# Patient Record
Sex: Male | Born: 1940 | Race: White | Hispanic: No | Marital: Married | Smoking: Never smoker
Health system: Southern US, Community
[De-identification: ages and names within clinical notes are randomized; demographics above are authoritative.]

## PROBLEM LIST (undated history)

## (undated) DIAGNOSIS — R413 Other amnesia: Secondary | ICD-10-CM

## (undated) DIAGNOSIS — R011 Cardiac murmur, unspecified: Secondary | ICD-10-CM

## (undated) DIAGNOSIS — I2510 Atherosclerotic heart disease of native coronary artery without angina pectoris: Secondary | ICD-10-CM

## (undated) DIAGNOSIS — I739 Peripheral vascular disease, unspecified: Secondary | ICD-10-CM

## (undated) DIAGNOSIS — R55 Syncope and collapse: Secondary | ICD-10-CM

## (undated) DIAGNOSIS — G4730 Sleep apnea, unspecified: Secondary | ICD-10-CM

## (undated) DIAGNOSIS — I779 Disorder of arteries and arterioles, unspecified: Secondary | ICD-10-CM

## (undated) DIAGNOSIS — E785 Hyperlipidemia, unspecified: Secondary | ICD-10-CM

## (undated) HISTORY — DX: Hyperlipidemia, unspecified: E78.5

## (undated) HISTORY — DX: Sleep apnea, unspecified: G47.30

## (undated) HISTORY — DX: Atherosclerotic heart disease of native coronary artery without angina pectoris: I25.10

## (undated) HISTORY — DX: Syncope and collapse: R55

## (undated) HISTORY — DX: Cardiac murmur, unspecified: R01.1

## (undated) HISTORY — DX: Other amnesia: R41.3

## (undated) HISTORY — PX: HAND SURGERY: SHX662

## (undated) HISTORY — PX: APPENDECTOMY: SHX54

## (undated) HISTORY — DX: Disorder of arteries and arterioles, unspecified: I77.9

## (undated) HISTORY — DX: Peripheral vascular disease, unspecified: I73.9

---

## 2002-05-30 ENCOUNTER — Ambulatory Visit (HOSPITAL_COMMUNITY): Admission: RE | Admit: 2002-05-30 | Discharge: 2002-05-31 | Payer: Self-pay | Admitting: Internal Medicine

## 2002-05-30 ENCOUNTER — Encounter: Payer: Self-pay | Admitting: *Deleted

## 2004-03-16 HISTORY — PX: CORONARY ARTERY BYPASS GRAFT: SHX141

## 2004-04-21 ENCOUNTER — Ambulatory Visit (HOSPITAL_COMMUNITY): Admission: RE | Admit: 2004-04-21 | Discharge: 2004-04-21 | Payer: Self-pay | Admitting: Vascular Surgery

## 2004-08-05 ENCOUNTER — Ambulatory Visit (HOSPITAL_COMMUNITY): Admission: RE | Admit: 2004-08-05 | Discharge: 2004-08-05 | Payer: Self-pay | Admitting: Cardiology

## 2004-08-05 ENCOUNTER — Ambulatory Visit: Payer: Self-pay | Admitting: *Deleted

## 2004-08-14 ENCOUNTER — Inpatient Hospital Stay (HOSPITAL_COMMUNITY): Admission: RE | Admit: 2004-08-14 | Discharge: 2004-08-18 | Payer: Self-pay | Admitting: Surgery

## 2006-05-05 ENCOUNTER — Ambulatory Visit: Payer: Self-pay | Admitting: Vascular Surgery

## 2007-07-01 ENCOUNTER — Ambulatory Visit: Payer: Self-pay | Admitting: Vascular Surgery

## 2007-07-08 ENCOUNTER — Ambulatory Visit: Payer: Self-pay | Admitting: Internal Medicine

## 2007-07-08 LAB — CONVERTED CEMR LAB
CO2: 31 meq/L (ref 19–32)
Calcium: 9.5 mg/dL (ref 8.4–10.5)
Chloride: 107 meq/L (ref 96–112)
Creatinine, Ser: 1 mg/dL (ref 0.4–1.5)
Eosinophils Relative: 1.7 % (ref 0.0–5.0)
GFR calc Af Amer: 96 mL/min
Glucose, Bld: 82 mg/dL (ref 70–99)
Lymphocytes Relative: 25.9 % (ref 12.0–46.0)
MCHC: 34.3 g/dL (ref 30.0–36.0)
MCV: 92 fL (ref 78.0–100.0)
Monocytes Relative: 10.2 % (ref 3.0–12.0)
Neutro Abs: 3.4 10*3/uL (ref 1.4–7.7)
Neutrophils Relative %: 62 % (ref 43.0–77.0)
Platelets: 143 10*3/uL — ABNORMAL LOW (ref 150–400)
RBC: 4.97 M/uL (ref 4.22–5.81)
RDW: 12.2 % (ref 11.5–14.6)

## 2007-07-15 ENCOUNTER — Ambulatory Visit (HOSPITAL_COMMUNITY): Admission: RE | Admit: 2007-07-15 | Discharge: 2007-07-15 | Payer: Self-pay | Admitting: Internal Medicine

## 2007-07-15 ENCOUNTER — Ambulatory Visit: Payer: Self-pay | Admitting: Internal Medicine

## 2007-11-18 ENCOUNTER — Encounter: Admission: RE | Admit: 2007-11-18 | Discharge: 2007-11-18 | Payer: Self-pay | Admitting: Neurology

## 2008-03-16 HISTORY — PX: CARDIAC CATHETERIZATION: SHX172

## 2008-04-26 ENCOUNTER — Ambulatory Visit: Payer: Self-pay | Admitting: Internal Medicine

## 2008-05-01 ENCOUNTER — Ambulatory Visit (HOSPITAL_COMMUNITY): Admission: RE | Admit: 2008-05-01 | Discharge: 2008-05-01 | Payer: Self-pay | Admitting: Cardiology

## 2008-05-01 ENCOUNTER — Ambulatory Visit: Payer: Self-pay | Admitting: Cardiology

## 2008-05-25 ENCOUNTER — Encounter: Payer: Self-pay | Admitting: Internal Medicine

## 2008-05-28 ENCOUNTER — Encounter: Payer: Self-pay | Admitting: Internal Medicine

## 2008-05-28 ENCOUNTER — Ambulatory Visit: Payer: Self-pay | Admitting: Internal Medicine

## 2008-05-28 DIAGNOSIS — I25810 Atherosclerosis of coronary artery bypass graft(s) without angina pectoris: Secondary | ICD-10-CM | POA: Insufficient documentation

## 2008-05-28 DIAGNOSIS — R002 Palpitations: Secondary | ICD-10-CM | POA: Insufficient documentation

## 2008-06-12 ENCOUNTER — Ambulatory Visit: Payer: Self-pay | Admitting: Vascular Surgery

## 2008-07-06 ENCOUNTER — Ambulatory Visit: Payer: Self-pay | Admitting: Internal Medicine

## 2008-07-06 ENCOUNTER — Encounter: Payer: Self-pay | Admitting: Internal Medicine

## 2008-07-23 ENCOUNTER — Encounter: Payer: Self-pay | Admitting: Internal Medicine

## 2008-07-23 ENCOUNTER — Ambulatory Visit: Payer: Self-pay

## 2008-08-08 ENCOUNTER — Telehealth: Payer: Self-pay | Admitting: Internal Medicine

## 2008-08-08 ENCOUNTER — Ambulatory Visit: Payer: Self-pay | Admitting: Internal Medicine

## 2008-08-08 DIAGNOSIS — R55 Syncope and collapse: Secondary | ICD-10-CM

## 2008-08-22 ENCOUNTER — Telehealth: Payer: Self-pay | Admitting: Internal Medicine

## 2008-12-06 ENCOUNTER — Ambulatory Visit: Payer: Self-pay | Admitting: Vascular Surgery

## 2008-12-24 ENCOUNTER — Ambulatory Visit: Payer: Self-pay | Admitting: Internal Medicine

## 2009-02-12 ENCOUNTER — Encounter: Payer: Self-pay | Admitting: Internal Medicine

## 2009-04-15 ENCOUNTER — Ambulatory Visit: Payer: Self-pay | Admitting: Internal Medicine

## 2009-06-07 ENCOUNTER — Encounter: Payer: Self-pay | Admitting: Internal Medicine

## 2009-09-24 ENCOUNTER — Ambulatory Visit: Payer: Self-pay | Admitting: Cardiovascular Disease

## 2009-09-25 ENCOUNTER — Ambulatory Visit: Payer: Self-pay | Admitting: Cardiology

## 2009-09-25 ENCOUNTER — Encounter: Payer: Self-pay | Admitting: Cardiovascular Disease

## 2009-09-25 ENCOUNTER — Encounter (HOSPITAL_COMMUNITY): Admission: RE | Admit: 2009-09-25 | Discharge: 2009-11-20 | Payer: Self-pay | Admitting: Cardiovascular Disease

## 2009-09-25 ENCOUNTER — Encounter: Payer: Self-pay | Admitting: Cardiology

## 2009-09-25 ENCOUNTER — Ambulatory Visit (HOSPITAL_COMMUNITY): Admission: RE | Admit: 2009-09-25 | Discharge: 2009-09-25 | Payer: Self-pay | Admitting: Cardiovascular Disease

## 2009-09-25 ENCOUNTER — Ambulatory Visit: Payer: Self-pay

## 2010-01-06 ENCOUNTER — Encounter: Payer: Self-pay | Admitting: Internal Medicine

## 2010-04-15 NOTE — Letter (Signed)
Summary: [facility] Office Visit Note   [facility] Office Visit Note   Imported By: Roderic Ovens 01/17/2010 12:49:21  _____________________________________________________________________  External Attachment:    Type:   Image     Comment:   External Document

## 2010-04-15 NOTE — Letter (Signed)
Summary: [facility]   [facility]   Imported By: Kassie Mends 03/19/2009 12:03:53  _____________________________________________________________________  External Attachment:    Type:   Image     Comment:   External Document

## 2010-04-15 NOTE — Letter (Signed)
Summary: [facility] Office Note  [facility] Office Note   Imported By: Roderic Ovens 06/21/2009 15:51:34  _____________________________________________________________________  External Attachment:    Type:   Image     Comment:   External Document

## 2010-04-15 NOTE — Assessment & Plan Note (Signed)
Summary: Cardiology Nuclear Testing  Nuclear Med Background Indications for Stress Test: Evaluation for Ischemia, Graft Patency   History: Angioplasty, Asthma, CABG, Heart Catheterization, Myocardial Perfusion Study  History Comments: '04 PTCA-Dx1, LAD;  4/06 YQM:VHQION,  EF= 40-50%; 6/06 CABG x 5; '09 Loop Recorder (not active now); 2/10 Cath:Occ. SVG>small Dx2, other grafts patent; h/o SVT, OSA  Symptoms: Dizziness, DOE, Palpitations, Syncope    Nuclear Pre-Procedure Cardiac Risk Factors: Carotid Disease, Family History - CAD, Hypertension, Lipids Caffeine/Decaff Intake: none NPO After: 5:00 PM Lungs: clear IV 0.9% NS with Angio Cath: 22g     IV Site: R AC IV Started by: Milana Na EMT-P Chest Size (in) 42     Height (in): 68 Weight (lb): 177 BMI: 27.01  Nuclear Med Study 1 or 2 day study:  1 day     Stress Test Type:  Stress Reading MD:  Marca Ancona, MD     Referring MD:  Lorine Bears, MD Resting Radionuclide:  Technetium 9m Tetrofosmin     Resting Radionuclide Dose:  11 mCi  Stress Radionuclide:  Technetium 39m Tetrofosmin     Stress Radionuclide Dose:  30 mCi   Stress Protocol Exercise Time (min):  9:30 min     Max HR:  142 bpm     Predicted Max HR:  152 bpm  Max Systolic BP: 206 mm Hg     Percent Max HR:  93.42 %     METS: 10.7 Rate Pressure Product:  62952    Stress Test Technologist:  Rea [employer] CMA-N     Nuclear Technologist:  Domenic Polite CNMT  Rest Procedure  Myocardial perfusion imaging was performed at rest 45 minutes following the intravenous administration of Myoview Technetium 68m Tetrofosmin.  Stress Procedure  The patient exercised for 9:30.  The patient stopped due to fatigue and denied any chest pain.  There were ST-T wave changes with exercise, that quickly resolved.  There were occasional PVC's and PAC's and he had a mild hypertensive response to exercise, 206/79.  Myoview was injected at peak exercise and myocardial perfusion imaging  was performed after a brief delay.  QPS Transient Ischemic Dilatation:  1.1  (Normal <1.22)  Lung/Heart Ratio:  .16  (Normal <0.45)  Quantitative Gated Spect Images QGS EDV:  107 ml QGS ESV:  50 ml QGS EF:  53 % QGS cine images:  Septal hypokinesis, may be due to prior cardiac surgery.    Overall Impression  Exercise Capacity: Good exercise capacity. BP Response: Hypertensive blood pressure response. Clinical Symptoms: Fatigue, no chest pain.  ECG Impression: NSR with baseline anterolateral and inferior T wave inversions.  2 mm horizontal ST depression at peak exercise, predominantly resolved by 50 seconds into recovery.  Overall Impression: Good exercise tolerance.  There were ischemic ECG changes noted on ETT.  No chest pain.  Normal perfusion images with no evidence of ischemia or infarction.  Normal TID ratio.  Overall Impression Comments: Equivocal study with ECG changes but normal perfusion images and good exercise tolerance in patient with known CAD.  Septal wall motion abnormality may be from prior CABG.

## 2010-04-15 NOTE — Miscellaneous (Signed)
Summary: Orders Update  Clinical Lists Changes  Orders: Added new Test order of Carotid Duplex (Carotid Duplex) - Signed 

## 2010-06-03 ENCOUNTER — Other Ambulatory Visit: Payer: Self-pay | Admitting: Cardiovascular Disease

## 2010-06-03 DIAGNOSIS — I6529 Occlusion and stenosis of unspecified carotid artery: Secondary | ICD-10-CM

## 2010-06-04 ENCOUNTER — Encounter (INDEPENDENT_AMBULATORY_CARE_PROVIDER_SITE_OTHER): Payer: 59 | Admitting: *Deleted

## 2010-06-04 DIAGNOSIS — I6529 Occlusion and stenosis of unspecified carotid artery: Secondary | ICD-10-CM

## 2010-06-17 ENCOUNTER — Encounter: Payer: Self-pay | Admitting: *Deleted

## 2010-06-17 ENCOUNTER — Telehealth: Payer: Self-pay | Admitting: *Deleted

## 2010-06-17 NOTE — Telephone Encounter (Signed)
Spoke with wife pt needs to have loop recorder removed is at [employer] and also is irritating breast bone per Dr Graciela Husbands schedule for 06/20/10 at 12:00 .Wife  Aware will give instructions to her as well as an order for labs to be done at [place] [facility].

## 2010-06-18 ENCOUNTER — Encounter: Payer: Self-pay | Admitting: Internal Medicine

## 2010-06-18 ENCOUNTER — Encounter: Payer: Self-pay | Admitting: Cardiovascular Disease

## 2010-06-20 ENCOUNTER — Ambulatory Visit (HOSPITAL_COMMUNITY): Payer: 59

## 2010-06-20 ENCOUNTER — Ambulatory Visit (HOSPITAL_COMMUNITY)
Admission: RE | Admit: 2010-06-20 | Discharge: 2010-06-20 | Disposition: A | Discharge status: Home / Self Care | Payer: 59 | Source: Ambulatory Visit | Attending: Internal Medicine | Admitting: Internal Medicine

## 2010-06-20 DIAGNOSIS — R0609 Other forms of dyspnea: Secondary | ICD-10-CM | POA: Insufficient documentation

## 2010-06-20 DIAGNOSIS — R55 Syncope and collapse: Secondary | ICD-10-CM | POA: Insufficient documentation

## 2010-06-20 DIAGNOSIS — I2510 Atherosclerotic heart disease of native coronary artery without angina pectoris: Secondary | ICD-10-CM | POA: Insufficient documentation

## 2010-06-20 DIAGNOSIS — R0989 Other specified symptoms and signs involving the circulatory and respiratory systems: Secondary | ICD-10-CM | POA: Insufficient documentation

## 2010-06-20 DIAGNOSIS — Z4509 Encounter for adjustment and management of other cardiac device: Secondary | ICD-10-CM | POA: Insufficient documentation

## 2010-06-24 ENCOUNTER — Encounter: Payer: Self-pay | Admitting: Cardiovascular Disease

## 2010-06-26 ENCOUNTER — Ambulatory Visit: Payer: 59 | Admitting: Cardiovascular Disease

## 2010-07-01 LAB — BASIC METABOLIC PANEL
BUN: 13 mg/dL (ref 6–23)
CO2: 29 mEq/L (ref 19–32)
Creatinine, Ser: 1.02 mg/dL (ref 0.4–1.5)
GFR calc non Af Amer: >60 mL/min (ref >60)

## 2010-07-01 LAB — CBC
RBC: 4.91 MIL/uL (ref 4.22–5.81)
RDW: 12.9 % (ref 11.5–15.5)

## 2010-07-09 NOTE — Op Note (Signed)
  NAME:  [name], [name]                ACCOUNT NO.:  [id number]  MEDICAL RECORD NO.:  [id number]           PATIENT TYPE:  O  LOCATION:  [facility]                         FACILITY:  [facility]  PHYSICIAN:  Duke Salvia, MD, FACCDATE OF BIRTH:  March 20, 1940  DATE OF PROCEDURE:  06/20/2010 DATE OF DISCHARGE:                              OPERATIVE REPORT   PREOPERATIVE DIAGNOSIS:  Syncope with previously implanted loop.  POSTOPERATIVE DIAGNOSIS:  Syncope with previously implanted loop.  PROCEDURE:  Explantation of the loop recorder.  DESCRIPTION:  Following obtaining informed consent, the patient was brought to the electrophysiology laboratory and placed on the fluoroscopic table in supine position.  After routine prep and drape, lidocaine was infiltrated overlying the previous incision and carried down to the layer of the device pocket.  The pocket was opened, the device was explanted and the retaining suture was removed.  The anterior and posterior aspect of the pocket were apposed with a 3-0 Vicryl suture.  The pocket was copiously irrigated with antibiotic containing saline solution.  The wounds were then closed in three layers in normal fashion.  The wound was washed, dried, benzoin and Steri-Strip dressing applied.  Needle counts, sponge counts and instrument counts were correct at the end of the procedure according to the staff.  The patient tolerated the procedure without apparent complication.     Duke Salvia, MD, [facility]     SCK/MEDQ  D:  06/20/2010  T:  06/21/2010  Job:  [id number]  Electronically Signed by Sherryl Manges MD [facility] on 07/09/2010 08:49:33 AM

## 2010-07-29 NOTE — Assessment & Plan Note (Signed)
[facility]                        [place] CARDIOLOGY OFFICE NOTE   NAME:Erichsen, HENDRICKS SCHWANDT                       MRN:          [id number]  DATE:12/24/2008                            DOB:          1940/06/27    Mr. Mysliwiec is seen in followup for syncope.  He has normal left  ventricular function.  He has a loop implanted.  He had no intercurrent  episodes.  He has no complaints of chest pain or shortness of breath.   His memory continues to be an issue.  He is scheduled to see Dr. Sandria Manly  next month or two.   His medications include Lipitor, Lexapro, aspirin.   On examination, his blood pressure today was 136/70, his pulse was 64.  His neck veins were flat.  His lungs were clear.  Heart sounds were  regular.  Extremities were without edema.  He is in no acute distress.   Interrogation of his loop monitor demonstrated only sinus rhythm.   IMPRESSION:  1. Syncope without recurrent events.  2. Status post loop recorder with prior events being associated with      sinus rhythm.  3. Dyslipidemia, on atorvastatin.   We will plan to recheck his lipids today.   We will plan to see him again in 3 months' time or following an other  syncopal episode.     Duke Salvia, MD, [facility]  Electronically Signed    SCK/MedQ  DD: 12/24/2008  DT: 12/25/2008  Job #: [id number]

## 2010-07-29 NOTE — Procedures (Signed)
CAROTID DUPLEX EXAM   INDICATION:  Follow up known carotid disease.   HISTORY:  Diabetes:  No  Cardiac:  Mitral valve prolapse and CABG in 2006.  Hypertension:  No  Smoking:  No  Previous Surgery:  CABG in 2006  CV History:  Amaurosis Fugax:  Paresthesias:  Hemiparesis:                                       RIGHT             LEFT  Brachial systolic pressure:         132               130  Brachial Doppler waveforms:         Biphasic          Biphasic  Vertebral direction of flow:        Antegrade         Antegrade  DUPLEX VELOCITIES (cm/sec)  CCA peak systolic                   120               94  ECA peak systolic                   284               255  ICA peak systolic                   305               86  ICA end diastolic                   90                24  PLAQUE MORPHOLOGY:                  Heterogeneous     Heterogeneous  PLAQUE AMOUNT:                      Moderate to severe                  Mild  PLAQUE LOCATION:                    ICA and ECA       ICA and ECA   IMPRESSION:  1. 60% to 79% stenosis noted in the right internal carotid artery.  2. 20% to 39% stenosis noted in the left internal carotid artery.  3. Antegrade bilateral vertebral arteries.   ___________________________________________  Di Kindle. Edilia Bo, M.D.   MG/MEDQ  D:  06/12/2008  T:  06/13/2008  Job:  [id number]

## 2010-07-29 NOTE — Assessment & Plan Note (Signed)
[facility]                        [place] CARDIOLOGY OFFICE NOTE   NAME:CHRISCOEDavie, [name]                       MRN:          [id number]  DATE:04/15/2009                            DOB:          1940-09-02    [name] is seen in followup for palpitations and syncope.  He had a  loop recorder implanted many years ago which has identified nothing,  with intercurrent recurrent syncope, no arrhythmia has been detected.   He has no complaints apart from shortness of breath.  He apparently has  a diagnosis of sleep apnea but has been reluctant to use a CPAP.   Current medications include Namenda, Lexapro, and Lipitor as well as  aspirin and Flomax.   PHYSICAL EXAMINATION:  VITAL SIGNS:  His blood pressure is 114/70, his  pulse was 89.  His weight was 173, which is down 4 pounds.  HEENT:  Normal.  LUNGS:  Clear.  HEART:  Sounds were regular.  Device pocket was well healed.  EXTREMITIES:  Without edema.   Interrogation of his loop was not possible.   IMPRESSION:  1. Syncope.  2. Status post loop recorder implantation, identified only sinus      rhythm with syncope, now at end of service.  3. Shortness of breath   We discussed whether we should takeout [name]'s loop recorder.  As  it has not been doing any harm, we have decided to leave it in for now.  It could always be removed in the future if he desires.   Related to his shortness of breath is a little bit concerning.  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  We will defer management of his dyspnea to them.   My recollection from his more remote chart was that he had normal LV  function, nonischemic Myoview.  We do know also that he has mild carotid  disease for which he had been followed by the vascular surgeons.     Duke Salvia, MD, [facility]  Electronically Signed   SCK/MedQ  DD: 04/15/2009  DT: 04/16/2009  Job #:  [id number]

## 2010-07-29 NOTE — Procedures (Signed)
CAROTID DUPLEX EXAM   INDICATION:  Followup carotid disease.   HISTORY:  Diabetes:  No.  Cardiac:  Mitral valve prolapse, CABG.  Hypertension:  No.  Smoking:  No.  Previous Surgery:  No.  CV History:  Currently asymptomatic.  Amaurosis Fugax No, Paresthesias No, Hemiparesis No                                       RIGHT             LEFT  Brachial systolic pressure:         142               148  Brachial Doppler waveforms:         Normal            Normal  Vertebral direction of flow:        Antegrade         Antegrade  DUPLEX VELOCITIES (cm/sec)  CCA peak systolic                   111               124  ECA peak systolic                   224               268  ICA peak systolic                   240               76  ICA end diastolic                   44                24  PLAQUE MORPHOLOGY:                  Mixed             Mixed  PLAQUE AMOUNT:                      Moderate / severe Mild  PLAQUE LOCATION:                    ICA/ECA/CCA       ICA/ECA/CCA   IMPRESSION:  1. 60-79% stenosis of the right internal carotid artery.  2. 1-39% stenosis of the left internal carotid artery.  3. No significant change noted when compared to the previous exam on      06/12/2008.   ___________________________________________  Di Kindle. Edilia Bo, M.D.   CH/MEDQ  D:  12/07/2008  T:  12/07/2008  Job:  [id number]

## 2010-07-29 NOTE — Assessment & Plan Note (Signed)
OFFICE VISIT   [name], [name]  DOB:  [birth year]                                       06/12/2008  CHART#:[id number]   I saw the patient in the office today for continued followup of his  carotid disease.  I had last seen him in February of 2008 when he had a  60-79% right carotid stenosis with minimal stenosis on the left.  I was  to see him back in 6 months, however, he was then lost to followup.  He  comes in today for a followup carotid study.  Of note, since I saw him  last he denies any history of stroke, TIAs, expressive or receptive  aphasia or amaurosis fugax.  His main complaint has been problems with  his memory which apparently began after his open heart surgery back in  2006.   PAST MEDICAL HISTORY:  His past medical history is significant for  coronary artery disease and previous history of hypertension.  He denies  any history of diabetes or history of congestive heart failure or COPD.   FAMILY HISTORY:  There is no history of premature cardiovascular  disease.   SOCIAL HISTORY:  He does not smoke tobacco.   REVIEW OF SYSTEMS:  He does have some occasional chest pain which has  been stable.  He notes occasional palpitations.  He has had a history of  atrial fibrillation in the past reportedly.  His only other complaint is  occasional dizziness.  Review of systems is otherwise unremarkable and  is documented on the medical history form in his chart.   PHYSICAL EXAMINATION:  This is a pleasant 70 year old gentleman who  appears his stated age.  His blood pressure is 126/67, heart rate is 67.  He has bilateral carotid bruits.  Lungs are clear bilaterally to  auscultation.  On cardiac exam he has a regular rate and rhythm.  Neurologic exam is nonfocal with good strength in his upper extremities  and lower extremities bilaterally.   Carotid duplex scan shows a 60-79% right carotid stenosis with a less  than 39% left carotid stenosis.  The  velocities on the right have  increased slightly compared with to his previous study a year ago.  However, the stenosis is clearly less than 80%.  Both vertebral arteries  are patent with normally directed flow.   I have explained we generally would not consider right carotid  endarterectomy unless the stenosis progressed to greater than 80% or he  developed new neurologic symptoms.  I plan on seeing him back in 6  months with a followup duplex scan.  He knows to call sooner if he has  problems.  In the meantime he knows to continue taking his aspirin.   Di Kindle. Edilia Bo, M.D.  Electronically Signed   CSD/MEDQ  D:  06/12/2008  T:  06/13/2008  Job:  [id number]   cc:   Dr Ritta Slot, M.D.

## 2010-07-29 NOTE — Procedures (Signed)
CAROTID DUPLEX EXAM   INDICATION:  Follow-up known carotid artery disease.   HISTORY:  Diabetes:  No.  Cardiac:  Mitral valve prolapse.  Hypertension:  No.  Smoking:  No.  Previous Surgery:  CABG in 2006.  CV History:  Amaurosis Fugax No, Paresthesias No, Hemiparesis No                                       RIGHT             LEFT  Brachial systolic pressure:         138               140  Brachial Doppler waveforms:         Biphasic          Biphasic  Vertebral direction of flow:        Antegrade         Antegrade  DUPLEX VELOCITIES (cm/sec)  CCA peak systolic                   78                __________  ECA peak systolic                   245               203  ICA peak systolic                   211               62  ICA end diastolic                   66                17  PLAQUE MORPHOLOGY:                  Heterogenous      Heterogenous  PLAQUE AMOUNT:                      Moderate          Mild  PLAQUE LOCATION:                    ICA, ECA          ICA, ECA   IMPRESSION:  1. 60-79% stenosis noted in the right internal carotid artery.  2. 1-39% stenosis noted in the left internal carotid artery.  3. Antegrade bilateral vertebral arteries.   ___________________________________________  Di Kindle. Edilia Bo, M.D.   MG/MEDQ  D:  07/01/2007  T:  07/01/2007  Job:  [id number]

## 2010-07-29 NOTE — Op Note (Signed)
NAME:  [name], [name]                ACCOUNT NO.:  [id number]   MEDICAL RECORD NO.:  [id number]          PATIENT TYPE:  OIB   LOCATION:  2854                         FACILITY:  [facility]   PHYSICIAN:  Duke Salvia, MD, FACCDATE OF BIRTH:  1940/08/27   DATE OF PROCEDURE:  DATE OF DISCHARGE:  07/15/2007                               OPERATIVE REPORT   PROCEDURE:  Following the obtaining of informed consent, the patient was  brought to the electrophysiology laboratory and placed on the  fluoroscopic table in the supine position.  After routine prep and  drape, lidocaine was infiltrated over the externally mapped area about 2  cm caudal of the clavicle and 1 cm lateral of the sternum, where  electrograms were optimal.  The incision was carried down to the layer  of the prepectoral fascia and the pocket was created using sharp  dissection and electrocautery.  Hemostasis was obtained.   Two 2-0 silk sutures were placed in the cephalad aspect of the pocket  and used to secure a St. Jude Confirm Implantable Cardiac Monitor, model  [id number], serial number [id number].  The pocket was then closed in 3 layers  in the normal fashion.  The wound was washed, dried, and a benzoin Steri-  Strip dressing was applied.  Needle counts, sponge counts, and  instrument counts were correct at the end of procedure according to the  staff.  The patient tolerated the procedure without apparent  complications.      Duke Salvia, MD, [facility]  Electronically Signed     SCK/MEDQ  D:  07/15/2007  T:  07/16/2007  Job:  [id number]   cc:   Electrophysiology Laboratory  [facility]  Dr. Lestine Mount

## 2010-07-29 NOTE — Discharge Summary (Signed)
NAME:  [name], [name]                ACCOUNT NO.:  [id number]   MEDICAL RECORD NO.:  [id number]          PATIENT TYPE:  OIB   LOCATION:  2854                         FACILITY:  [facility]   PHYSICIAN:  Duke Salvia, MD, FACCDATE OF BIRTH:  [birth year]   DATE OF ADMISSION:  07/15/2007  DATE OF DISCHARGE:  07/15/2007                               DISCHARGE SUMMARY   FINAL DIAGNOSES:  1. Presyncope.      a.     No syncopal events.      b.     Monitor evaluation shows nonsustained ventricular       tachycardia/ST/2.5-second pauses/one 7-beat run of       supraventricular tachycardia.   SECONDARY DIAGNOSES:  1. Ischemic cardiomyopathy with recent Myoview study ejection fraction      of 63%.  2. Coronary artery bypass graft surgery, August 14, 2004.  3. History of supraventricular tachycardia controlled with verapamil      and Lanoxin.  4. Carotid duplex study showing 70% right internal carotid artery      stenosis.  5. Asthma.  6. Gastroesophageal reflux disease.  7. Peptic ulcer disease.  8. Hypertension.  9. Mild dementia.   PROCEDURE:  Jul 15, 2007, implant of loop recorder by Dr. Sherryl Manges.   HISTORY:  [name] is a 70 year old male with a history of ischemic  heart disease, bypass surgery in 2006.  His ejection fraction in a  recent Myoview study was read as 63%.   The patient has been having episodes of profound presyncope.  He  describes episodes of dizziness with his eyes darkening with some  presyncope.  He has had no actual frank syncope and he denies falls.  The episodes are quite brief.   He has a history of SVT in the past.  He has been prescribed verapamil  and Lanoxin.  He is taking these.  Carotid ultrasound showed a 70%  narrowing in the right internal carotid artery which is chronic.  He had  an event recorder.  This showed nonsustained VT, some sinus tachycardia,  a 2.5-second pause which occurred at midday, and a short run of PSVT.   These recurrent spells  of presyncope with monitor abnormalities would  warrant a more efficient diagnostic tool which is the implantable loop  recorder.  They wish to proceed with this and this was done on the first  convenient time.   HOSPITAL COURSE:  The patient presents on Jul 15, 2007.  Loop recorder  was implanted by Dr. Sherryl Manges.  The patient has had no post  procedural complications.  He follows up with Dr. Odessa Fleming office in 2  weeks for a wound check.   DISCHARGE MEDICATIONS:  His medications at discharge have remained the  same from those at home, they are:  1. Lipitor 40 mg daily at bedtime.  2. Enteric-coated aspirin daily.  3. Verapamil 180 mg daily.  4. Digoxin 0.125 mg daily.  5. Lexapro 10 mg daily.  6. Flomax 0.4 mg daily.   Once again, follow up appointment will be made with Dr. Sherryl Manges in  the office.  Thank you.      Maple Mirza, [place]      Duke Salvia, MD, [facility]  Electronically Signed    GM/MEDQ  D:  07/15/2007  T:  07/16/2007  Job:  [id number]   cc:   Harl Bowie, M.D.  Feliciana Rossetti, MD

## 2010-07-29 NOTE — Letter (Signed)
April 27, 2008    Dr. Lenard Galloway Dhatt  [address]  [place], [place]  [address]   RE:  WALFRED, BETTENDORF  MRN:  [id number]  /  DOB:  [birth year]   Dear Lenard Galloway,   I hope I have a talk with you about Dameon Soltis before this letter  arrives.  It was a pleasure to see him at the Dr. Patsy Baltimore request for  other thoughts regarding his tachycardia identified by the event  recorder.   As you know, he is a 70 year old gentleman with some degree of dementia,  a history of ischemic heart disease with bypass surgery in 2006, and  ejection fraction relatively recently at 63%.  He has had recurrent  episodes of abrupt onset of weakness and a loop recorder was implanted  to try to identify an arrhythmic cause for these.  To date, he has had  repeated episodes but nothing has been detected with his tachycardia  detection rate at greater than 160 beats per minute and over 24 beats in  duration.   When his event recorder was reviewed earlier this winter, recurrent  episodes of tachycardia were noted that were felt to be consistent with  atrial flutter, Coumadin therapy was initiated and he was referred to  Dr. Concepcion Elk in [facility] for consideration of therapies.  At that  point, his impression was that this represented a supraventricular  tachycardia possibly atrial flutter, possibly atrial tachycardia and  recommended EP testing with catheter ablation and if nothing were  inducible empiric ablation of the cavotricuspid isthmus.  The patient's  thromboembolic risk factors are notable for vascular disease and  hypertension and age which gives him a CHADS score of 1 and a CHADS best  score of 3.   The patient has recurrent episodes of palpitations.  Interestingly,  however, there has been no correlation repeatedly between his symptoms  and identified tachycardia again with the cava as noted above related to  his programming.  Nonsustained ventricular tachycardia has also been  identified, and he  has been treated with a combination of verapamil and  digoxin with control, although he continues to have documented spells  and documented symptoms.   He has also been complaining recently of chest discomfort that is  enough to make him cry.  He has been responsive to nitroglycerin and  Myoview scanning is anticipated next week.   His past medical history is notable for,  1. Asthma.  2. GE reflux disease and peptic ulcer disease.  3. Urinary problems.  4. Memory issues.  5. Sexual dysfunction.   His review of systems across multiple organ systems is reviewed and is  noncontributory.   His past surgical history is notable for appendectomy and hand surgery  in addition to his bypass surgery.   SOCIAL HISTORY:  He is married.  He has one child.  He does not use  cigarettes, alcohol, or recreational drugs.  He does walk.  He works on  farm.   His medications currently include Lipitor 40, aspirin, Lexapro, Flomax,  warfarin, and aspirin previously prescribed rate control apparently is  no longer being taken.   ALLERGIES:  ALDOMET, CAPOTEN, and TOPROL (specifics not known).   On examination, he is an older Caucasian male appearing somewhat older  than his stated age of 28.  His blood pressure was 158/64.  His pulse  was 64.  His weight was 180 pounds which is up 7 pounds in the last 9  months.  His  HEENT exam demonstrated no icterus or xanthoma.  His neck  veins were flat.  The carotids are brisk and full bilaterally without  bruits.  The back was without kyphosis or scoliosis.  Lungs were clear.  Heart sounds were regular without murmurs or gallops.  The abdomen was  soft with active bowel sounds without midline pulsation or hepatomegaly.  Femoral pulses were 2+.  Distal pulses were intact.  There is no  clubbing, cyanosis, or edema.  Neurological exam was grossly normal.  Skin was warm and dry.   A loop recorder demonstrated recurrent episodes of tachycardia  oscillating  around the cycle length of 365-370 milliseconds with the  tachycardia cut off being 160 beats per minute or 376 milliseconds or  so.   There were some irregularity.  There seemed to be a discrete deflection  in the ST-segment in the long RR interval of 414 milliseconds, but I  could march it out.  There is one episode of nonsustained ventricular  tachycardia.  As noted previously, episodes of weakness were profound  associated with power are associated with no documented arrhythmia.   IMPRESSION:  1. Supraventricular tachycardia, question mechanism.  2. Nonsustained ventricular tachycardia.  3. Episodes of profound weakness not related to arrhythmia.  4. Nonischemic heart disease with      a.     Prior bypass.      b.     Normal left ventricular function.  5. Not on beta-blocker.  6. Dementia.   Mickey, Mr. 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  As best as I can tell these episodes are asymptomatic.  If that  is the case, then the reason to treat them aggressively would be to  minimize comorbid events, specifically thromboembolism in the setting of  an atrial arrhythmia associated with such risk.  This would likely be  then an atrial flutter with very rapid atrial contractions probably more  than 200 beats per minute or 200-250 beats per minute that we would  anticipate be is associated with impaired left atrial appendage  function.  I initially thought yesterday when I saw them that EP test  would be the best way to do this.  I have subsequently thought that  maybe reprogramming his loop recorder may give [place] the transitions into  tachycardia and they are with a better understanding what the mechanism  is.  The other thing that may be helpful with these spells that he is  having is tilt table testing looking for vasomotor instability.  While  [place] yesterday she recorded his blood pressure with these episodes of  140 some odd, these recordings have  actually been done after he has sat  down and his symptoms at least have begun to abate.   I have reviewed this extensively with Glenna and her husband.   We will look for the hearing from you after the Myoview scan.    Sincerely,      Duke Salvia, MD, [facility]  Electronically Signed    SCK/MedQ  DD: 04/27/2008  DT: 04/28/2008  Job #: [id number]

## 2010-07-29 NOTE — Assessment & Plan Note (Signed)
[facility]                        [place] CARDIOLOGY OFFICE NOTE   NAME:Bellew, GIOVANNIE SCERBO                       MRN:          [id number]  DATE:09/24/2009                            DOB:          02/23/1941    CHIEF COMPLAINT:  Syncope.   HISTORY OF PRESENT ILLNESS:  Mr. Abel was added today to my schedule  on an urgent basis due to his episode of syncope.  He does have previous  history of recurrent syncope and was evaluated in the past by Dr. Graciela Husbands.  He actually underwent extensive workup including ischemic workup as well  as a loop record.  No arrhythmias were identified in the past.  It was  felt that his symptoms were due to orthostatic or vasopressor syncope.  His previous episodes of syncope were in the setting of changing  position or standing up.  Today, he was standing up by table for about  10 minutes and felt all of sudden dizzy and lightheaded.  This was  followed by loss of consciousness.  His wife who works in our office was  actually there and witnessed the event.  The patient did not have any  physical injuries.  He regained his consciousness right after he fell.  There was no disorientation or incontinence.  The episode was somewhat  similar to his previous episodes.  He did not have any chest pain,  shortness of breath, or palpitations before the syncopal episode.  He  has been on the same medications except for Namenda which was added in  the last 6 months.  He has been eating and drinking fine.  He did have a  few episodes lately of feeling tired and one time, he had chest pain  with activities.  He has chronic exertional dyspnea.  He was taken to  the emergency room at [facility] where his basic workup was  unremarkable.  His blood work showed normal CBC.  His renal function was  normal with no electrolyte abnormalities.  Cardiac enzymes were  negative.  He had an electrocardiogram done which showed normal sinus  rhythm, but slightly bradycardic at the rate of 56 with ST and T-wave  changes in lead V4-V6, suggestive of lateral ischemia.  His chest x-ray  was unremarkable.   PAST MEDICAL HISTORY:  1. Coronary artery disease status post coronary artery bypass graft      surgery in 2006.  Most recent cardiac catheterization was done in      2010, which showed patent LIMA to LAD, patent SVG to D1, patent SVG      to OM-3, and an occluded SVG to a small D2.  There was a diffuse      70% disease in a small caliber OM-1.  Ejection fraction was normal.  2. Recurrent syncope with previous extensive workup.  No arrhythmia      was identified.  3. Sleep apnea.  4. Hyperlipidemia.  5. Memory loss after his bypass surgery.  6. Carotid artery disease on the right side which is 60-79%.   CURRENT MEDICATIONS:  1. Lipitor 40 mg daily.  2.  Lexapro 10 mg day.  3. Aspirin 325 mg daily.  4. Multivitamin once daily.  5. Flomax 0.4 mg once daily.  6. Namenda 10 mg twice daily.  7. Nitroglycerin 0.4 mg sublingual as needed.  8. Cialis 10 mg as needed.   PHYSICAL EXAMINATION:  GENERAL:  Overall, he appears to be in no acute  distress.  VITAL SIGNS:  His weight is 178.4 pounds, oxygen saturation 94% on room  air.  Heart rate in the lying position was 68 with a blood pressure of  135/71.  In the sitting position, heart rate was 81 with a blood  pressure of 120/66.  In the standing position, his heart rate was 95  with a blood pressure of 114/71.  He feels dizzy and weak at this  position.  NECK:  Reveals no masses, no carotid bruit, or JVD.  RESPIRATORY:  Normal respiratory efforts with no use of accessory  muscles.  Auscultation of lungs reveals normal breath sounds with no  rales or crackles.  CARDIOVASCULAR:  Normal PMI.  Auscultation reveals normal S1 and S2.  The patient does have 2 different murmurs.  There is a 2/6 systolic  ejection murmur at the aortic area as well as another different  character  blowing murmur at the apex which is also 2/6.  ABDOMEN:  Benign, nontender, nondistended.  EXTREMITIES:  No clubbing, cyanosis, or edema.  Pedal pulses are normal.  Carotid exam reveals no bruits.  NEUROLOGIC:  No focal signs that identified.  The patient is alert,  oriented to time, place, and person.   IMPRESSION:  1. Syncope:  The patient is significantly orthostatic by physical      exam.  His labs did not show any evidence of dehydration.  I      suspect that orthostatic hypotension is etiology for his recurrent      syncope.  He has had arrhythmia workup in the past including loop      recorder which did not identify any arrhythmia.  The only new      medication for him over the last 6 months is Namenda which can      cause dizziness but I don't think this is the primary cause.  It      seems like this medication has not been helping his memory      according to the patient.  If that is the case, then consider      stopping the medication.  He does have history of carotid stenosis      on the right side which was less estimated at 60-79%.  I think it      is important to get a followup carotid Doppler to further evaluate      this and ensure that it is not contributing to his syncopal      episodes which is doubtful.  2. Known history of coronary artery disease with recent episodes of      chest pain.  His EKG shows some lateral ischemic changes.  Given      his recent symptoms and the current episode of syncope, we will go      ahead and obtain a nuclear stress test to evaluate for ischemia.      In the meanwhile, we will continue with medical therapy with      aspirin and Lipitor.  His ejection fraction was normal in the past.  3. Cardiac murmurs:  He has 2 murmurs, one of them suggestive for  aortic sclerosis or mild stenosis and the other one suggestive of      mitral regurgitation.  We will obtain an echocardiogram to evaluate      this.  He will follow up with [place] after  his further cardiac workup.     Lorine Bears, MD  Electronically Signed    MA/MedQ  DD: 09/25/2009  DT: 09/25/2009  Job #: [id number]

## 2010-07-29 NOTE — Letter (Signed)
July 08, 2007    Harl Bowie, M.D.  [address]  [place], [place]  [address]   RE:  HOLDAN, STUCKE  MRN:  [id number]  /  DOB:  [birth year]   Dear Lenard Galloway:   It was a pleasure to see Mr. Youngblood and Frazier Butt last week in the office  for recurrent spells.   As you know he is a 70 year old gentleman with ischemic heart disease,  prior bypass surgery, and mild depression of left ventricular systolic  function in the past with a recent Myoview demonstrating ejection  fraction of 63% as read by Dr. Tomie China.  He has been having episodes of  profound presyncope.  He is not able to give a very good history, and so  Frazier Butt gives it for him.  He describes episodes of dizziness with  darkness in front of his eyes and some presyncope.  He has had no  syncope and denies falls.  These episodes are quite brief.  He has a  history of SVT in the past for which he was prescribed verapamil and  Lanoxin and has been taking.  Carotid ultrasound in the fall  demonstrated a 70% narrowing in his right ICA, but this was not  significantly changed.  An event recorder he obtained demonstrated a  number of abnormalities including: 1) nonsustained ventricular  tachycardia, 2) a sinus tachycardia, 3) 2.5 second sinus pause occurring  at midday and 4) a nonsustained run of paroxysmal supraventricular  tachycardia about 7 beats.   PAST MEDICAL HISTORY:  Notable for:   1. Asthma.  2. GE reflux disease.  3. Ulcers.  4. Urinary problems.  5. Sexual dysfunction.  6. Short-term memory issues.   CURRENT MEDICATIONS:  1. Lipitor 40.  2. Aspirin.  3. Verapamil 180.  4. Digoxin 0.125.  5. Lexapro 10.  6. Flomax 0.4.   PAST SURGICAL HISTORY:  Appendectomy, bypass, and hand surgery.   SOCIAL HISTORY:  He is married.  He has one child.  He does not use  cigarettes, alcohol or recreational drugs.  He walks a couple times a  week, and drinks a couple cups of coffee a day.   PHYSICAL EXAMINATION:  GENERAL:   He is an older Caucasian male appearing  his stated age of 41.  VITAL SIGNS:  Blood pressure is 139/69, pulse 54, weight 173.  HEENT:  Demonstrated no icterus or xanthoma.  NECK:  Neck veins were flat.  The carotids were brisk and full.  I do  not recall a bruit, though apparently he heard one on the left.  CHEST:  Clear.  BACK:  Without kyphosis or scoliosis.  HEART:  Heart sounds were regular with an S4.  ABDOMEN:  Soft with active bowel sounds without midline pulsation or  hepatomegaly.  EXTREMITIES:  Femoral pulses were 2+, distal pulses were intact.  There  is no clubbing, cyanosis or edema.  NEUROLOGICAL:  Grossly normal apart  from the memory issues.  SKIN:  Warm and dry.   Electrocardiogram dated on April 24 demonstrated sinus rhythm at 54 with  1.8/1.2/0.43.  The axis was mildly leftward.  There were T-wave changes  on the inferolateral leads.   IMPRESSION:  1. Recurrent presyncope.  2. Asymptomatic recordings from his Cardizem monitor demonstrating:      a.     Significant pausing of 2.5 seconds.      b.     Nonsustained ventricular tachycardia.      c.     Nonsustained supraventricular  tachycardia.  3. Ischemic heart disease with:      a.     Prior bypass.      b.     Most recent assessment demonstrating normal left ventricular       function with Cardiolite imaging demonstrating no ischemia and I       presume also no prior infarction.  4. Mild dementia.  5. Hypertension.   Mickey, Mr. Leon is having recurrent spells of presyncope with a  number of abnormalities seen on the short-term monitor utilized.  As I  spoke with Glenna and him I think that the most efficient diagnostic  tool is probably the implantable loop recorder, and they were quite  primed for this I presume based upon conversations with you.   We discussed potential benefits as well as potential risks, and will  plan to proceed on May 1.   Mickey, I hope that this letter finds you well.     Sincerely,      Duke Salvia, MD, [facility]  Electronically Signed    SCK/MedQ  DD: 07/14/2007  DT: 07/14/2007  Job #: [id number]   CC:    Feliciana Rossetti, MD

## 2010-07-29 NOTE — Cardiovascular Report (Signed)
NAME:  [name], [name]                ACCOUNT NO.:  [id number]   MEDICAL RECORD NO.:  [id number]           PATIENT TYPE:   LOCATION:                                 FACILITY:   PHYSICIAN:  [name] Morton. Riley Kill, MD, FACCDATE OF BIRTH:  [birth year]   DATE OF PROCEDURE:  DATE OF DISCHARGE:                            CARDIAC CATHETERIZATION   INDICATIONS:  [name] is a 70 year old gentleman with prior pacing  and bypass surgery.  Bypass was done in 2006.  At that time, he had an  internal mammary to the LAD, saphenous vein graft to the second  diagonal, sequential saphenous vein graft to the first diagonal, and  distal obtuse marginal, and saphenous vein graft to the PDA.  He has not  had recurrent chest discomfort.  He has, however, had some recurrent  episodes of palpitations, and he has had some nonsustained ventricular  tachycardia.  A loop recorder is in place.  The current study is done to  assess coronary anatomy.  Of note, his prior radionuclide imaging study  was apparently normal, just prior to need for bypass.   PROCEDURES:  1. Left heart catheterization.  2. Selective coronary arteriography.  3. Selective left ventriculography.  4. Saphenous vein graft angiography.  5. Selective left internal mammary angiography.   DESCRIPTION OF THE PROCEDURE:  The patient was brought to the  catheterization laboratory and prepped and draped in the usual fashion.  Intravenous Versed 1 mg was given for sedation.  The right groin was  prepped and draped.  Through an anterior puncture, the femoral artery  was easily entered.  Following this, views of the left and right  coronary arteries were obtained.  Vein graft angiography and internal  mammary angiography was then performed.  We did use the internal mammary  catheter to engage the internal mammary and the right bypass catheter to  engage the PDA graft.  There were no major complications.  I then  reviewed the studies with the patient and  subsequently with his wife,  [name].  [name] was called.  He was taken to the holding area for  sheath removal.   HEMODYNAMIC DATA:  1. Central aortic pressure was 132/71, mean 97.  2. Left ventricular pressure was 146/9.  3. There was no gradient or pullback across the aortic valve.   ANGIOGRAPHIC DATA:  1. On plain fluoroscopy, there is evidence of fairly diffuse      calcification of the coronary vessels.  2. The left main is free of critical disease.  3. The left anterior descending artery has calcified plaque proximally      of about 50%.  There is probably 50-70% in takeoff of diagonals.      The diagonals appear to come off at a fairly uniform location.      There is no obvious flow into the first diagonal.  The second      diagonal has flow, has 70% mid disease, and has evidence of a      retrograde thrombosed vein graft.  The vessel was small in caliber  and diffusely diseased.  4. The saphenous vein graft to the second diagonal is occluded.  5. The internal mammary to the distal LAD does appear to be intact.      It is widely patent.  There was excellent runoff into the distal      vessel.  The distal vessel may have some diffuse plaque, but with      competitive flow, it is difficult to entirely see the morphology.      No high-grade disease is noted in the distal LAD.  6. The circumflex provides basically 3 marginal branches.  The first      marginal branch is  unbypassed.  There is diffuse segmental plaque      proximally with 70-75% disease.  There is diffuse luminal      irregularity as it goes distal where the vessel bifurcates.  In      overall caliber, the vessel appears to be about 2 mm artery      proximally and 1.5- to 1.8-mm vessel distally.  The AV circumflex      then provides a second small marginal branch, then a third marginal      branch that has ostial disease of 80%.  There is evidence of      competitive filling more distally.  7. There is a  sequential saphenous vein graft to the first diagonal      and OM-3.  This vein graft is widely patent with perhaps mild      narrowing near the insertion of the first diagonal but this does      not exceed about 30-40% luminal reduction.  8. The right coronary artery is diffusely diseased with a competitive      flow distally.  9. The vein graft to the PDA is widely patent with very minimal      changes.  10.Ventriculography in the RAO projection reveals global systolic      function that is within normal limits.  No definite wall motion      abnormalities are seen.   CONCLUSION:  1. Patent internal mammary to the left anterior descending artery.  2. Patent saphenous vein graft to the diagonal 1 and obtuse marginal      3.  3. Occluded saphenous vein graft to a small diagonal 2.  4. Patent saphenous vein graft to the posterior descending artery.  5. A 70-75% diffuse plaquing in a smaller caliber obtuse marginal 1.  6. Preserved overall left ventricular function.   DISPOSITION:  The patient has evidence of predominantly patent vein  grafts in mammary artery.  The LIMA to the LAD, vein grafts to the  diagonal 1 and OM-3 and vein grafts to the PDA remain patent, and his  overall LV function is preserved.  If there is ischemia, it could be  located to the first obtuse marginal branch or second diagonal location.  Both are fairly small in caliber.  With the absence of symptoms,  revascularization would not be recommended and medical treatment program  suggested.  In the holding area, his loop recorder is being  interrogated, and Dr. Graciela [name] will manage his arrhythmia.  He will follow  up with Dr. Tawanna Cooler [name] next week and resume his Coumadin.      [name] Morton. Riley Kill, MD, [facility]  Electronically Signed     TDS/MEDQ  D:  05/01/2008  T:  05/01/2008  Job:  [id number]   cc:   [name] Salvia, MD, [facility]  [name],  M.D.  [name], M.D.

## 2010-07-29 NOTE — Assessment & Plan Note (Signed)
OFFICE VISIT   [name]  DOB:  06-Jan-1941                                       12/06/2008  CHART#:[id number]   I saw the patient in the office today for continued followup of his  carotid disease.  This is a pleasant 70 year old gentleman who I have  been following with a moderate right carotid stenosis.  He comes in for  a routine 6 month followup visit.  Since I saw him last he has had no  history of stroke, TIAs, expressive or receptive aphasia or amaurosis  fugax.  There has been no significant change in his medical history.   REVIEW OF SYSTEMS:  He has had no recent chest pain, chest pressure,  palpitations or arrhythmias.  He has had no productive cough,  bronchitis, asthma or wheezing.   PHYSICAL EXAMINATION:  This is a pleasant 70 year old gentleman who  appears his stated age.  His blood pressure is 144/71, heart rate is 67.  I do not detect any carotid bruits.  Lungs are clear bilaterally to  auscultation.  On cardiac exam he has a regular rate and rhythm.  Neurologic exam is nonfocal.   Carotid duplex scan in our office today shows that he has a 60-79% right  carotid stenosis with no significant stenosis on the left.  The  velocities on the right have actually decreased slightly compared to 6  months ago.  Both vertebral arteries are patent with normally directed  flow and arm pressures are essentially equal.   He understands we would not consider right carotid endarterectomy unless  the stenosis progressed to greater than 80% or he developed new  neurologic symptoms.  I plan on seeing him back in 6 months for followup  carotid duplex scan.  He knows to call sooner if he has problems.  In  the meantime he knows to continue taking his aspirin.   Di Kindle. Edilia Bo, M.D.  Electronically Signed   CSD/MEDQ  D:  12/06/2008  T:  12/07/2008  Job:  [id number]   cc:   [name], M.D.  [name], M.D.

## 2010-08-01 NOTE — Cardiovascular Report (Signed)
NAME:  [name], [name]                ACCOUNT NO.:  [id number]   MEDICAL RECORD NO.:  [id number]          PATIENT TYPE:  OIB   LOCATION:  2855                         FACILITY:  [facility]   PHYSICIAN:  Carole Binning, M.D. LHCDATE OF BIRTH:  October 04, 1940   DATE OF PROCEDURE:  08/05/2004  DATE OF DISCHARGE:  08/05/2004                              CARDIAC CATHETERIZATION   PROCEDURE PERFORMED:  Left heart catheterization with coronary angiography  and left ventriculography.   INDICATIONS:  The patient is 70 year old male with history of coronary  disease and previous percutaneous coronary intervention to the diagonal  branch. He has had symptoms of progressive exertional angina. He was  referred by Dr. Thereasa Solo Dhatt for cardiac catheterization.   PROCEDURE NOTE:  A 6-French sheath was placed in the right femoral artery.  Coronary angiography was performed using standard Judkins 6-French  catheters. Left ventriculography was performed with an angled pigtail  catheter. Contrast was Omnipaque. At the conclusion of the procedure, a 6-  [place] AngioSeal vascular closure device was placed in the right femoral  artery with good hemostasis. There were no complications.   HEMODYNAMIC RESULTS:  Left ventricular pressure 130/12. Aortic pressure  140/72. There is no aortic valve gradient.   LEFT VENTRICULOGRAM:  Wall motion is normal. Ejection fraction estimated  60%. There is no mitral regurgitation.   CORONARY ANGIOGRAPHY:  1.  Left main is normal.  2.  Left anterior descending artery has moderate calcification the proximal      midvessel. There is a 75% stenosis in the ostium of the LAD and a      diffuse 75% stenosis in the mid LAD extending across the origin of      second diagonal branch. The first diagonal branch itself is normal in      size and has a 90% stenosis at the ostium. The second diagonal branch is      small to normal in size and has a 50% stenosis in the midbody.  3.  Left  circumflex has a 70% stenosis in the proximal vessel and a 75%      stenosis in the midvessel. The circumflex gives rise to a small first      obtuse marginal, large bifurcating second obtuse marginal, small third      obtuse marginal. There is a 40% stenosis in the midbody of the second      obtuse marginal.  4.  Right coronary is a dominant vessel. In the proximal vessel there is a      60-70% stenosis. In the mid vessel there is a 40% stenosis. The distal      right coronary gives rise to large posterior descending artery and two      small posterolateral branches. There is a 30% stenosis in the proximal      portion of posterior descending artery.   IMPRESSION:  1.  Normal left ventricular systolic function.  2.  Complex three-vessel coronary disease as described.   PLAN:  Based on the presence of three-vessel coronary disease and the  patient symptoms of progressive exertional angina, I  would recommend  coronary bypass surgery. The patient will be referred to cardiovascular  surgery for further evaluation.      MWP/MEDQ  D:  08/05/2004  T:  08/05/2004  Job:  [id number]   cc:   Harl Bowie, M.D.  [address]  [place]  [place] [address]  Fax: [phone]   Cardiac Catheter Lab

## 2010-08-01 NOTE — Discharge Summary (Signed)
NAME:  [name]                            ACCOUNT NO.:  [id number]   MEDICAL RECORD NO.:  [id number]                   PATIENT TYPE:  OIB   LOCATION:  6527                                 FACILITY:  [facility]   PHYSICIAN:  Duke Salvia, M.D. [facility]           DATE OF BIRTH:  [birth year]   DATE OF ADMISSION:  05/30/2002  DATE OF DISCHARGE:  05/31/2002                                 DISCHARGE SUMMARY   HISTORY OF PRESENT ILLNESS:  This is a 70 year old gentleman admitted for a  cardiac catheterization with Veneda Melter, M.D.  The patient has a history of  ischemic heart disease and dyslipidemia, who is very active.  He experiences  chest tightness in the left anterior chest with strenuous activity.  No  discomfort with moderate activity.  No rest angina.  The patient underwent  Cardiolite images, which were negative for ischemia and showed scar.  EKG at  two minutes had ST depression and positive ST changes at least for nine  minutes.  Symptoms with dyspnea.  No angina.  The patient was advised for  cardiac catheterization.  Cardiac catheterization was set up with Veneda Melter, M.D., on May 30, 2002.   HOSPITAL COURSE:  The patient was admitted for cardiac catheterization.  He  underwent a cardiac catheterization on May 30, 2002, which showed left mid  30%, mid LAD long 60%, mid distal 95%, 80% D1, left circumflex 50%, AV  circumflex RCA 50-60% proximal mid, and EF 55%.  The patient was noted to  have coronary artery disease with normal LV systolic function.  He had a PCA  of his D1.  Veneda Melter, M.D., had spoke with Harl Bowie, M.D.  A stress  echocardiogram will be set up at Dr. Carole Civil office next week.  He was  started on Toprol XL 25 mg daily and Lipitor 40 mg daily.   DISPOSITION:  He was discharged to home in stable condition on the following  medications.   DISCHARGE MEDICATIONS:  1. Coated aspirin 325 mg daily.  2. Lipitor 40 mg daily at night.  3. Toprol XL 50 mg  half of a tablet daily.  4. Calan SR 240 mg a day.  5. Tylenol one to two tablets every four to six hours as needed.   ACTIVITY:  No heavy lifting or strenuous activity for four days.  No driving  for two days.   DIET:  Low-fat, low-salt, low-cholesterol diet.   WOUND CARE:  He was to call if he developed any drainage in his groin.    FOLLOW-UP:  He is to have a liver function test and repeat lipids in four  weeks.  An appointment was scheduled with Harl Bowie, M.D., on June 05, 2002, at 2:45 p.m., as well as Veneda Melter, M.D., on July 13, 2002, at 10  a.m.     Chinita Pester, C.R.N.P. [facility]  Duke Salvia, M.D. [facility]    DS/MEDQ  D:  05/31/2002  T:  06/01/2002  Job:  [id number]   cc:   Veneda Melter, M.D. [facility] Dhatt, M.D.  [address]  [place]  [place] [address]  Fax: [phone]

## 2010-08-01 NOTE — Op Note (Signed)
NAME:  [name], [name]                ACCOUNT NO.:  [id number]   MEDICAL RECORD NO.:  [id number]          PATIENT TYPE:  OIB   LOCATION:  NA                           FACILITY:  [facility]   PHYSICIAN:  Di Kindle. Edilia Bo, M.D.DATE OF BIRTH:  06-Jul-1940   DATE OF PROCEDURE:  04/21/2004  DATE OF DISCHARGE:                                 OPERATIVE REPORT   PREOPERATIVE INDICATIONS:  This is a 70 year old right hand gentleman who  was found to have a right carotid bruit. This carotid duplex scan which  showed a 60 to 79% right carotid stenosis. He was seen in consultation in  the office on April 16, 2004,  and velocities suggested  a probably 60% to  70% right carotid stenosis. He described one very brief episode of numbness  in the left arm which lasted 30 seconds and he had no previous episodes and  no subsequent episodes. Given that he only had a moderate stenosis it was  not clear whether this was symptomatic and also some slightly turbulent flow  in the distal internal carotid artery.  Cerebral arteriogram was recommended  to determine how severe the stenosis was as if this was a smooth less than  70% stenosis we might elect simply to follow this were as if it was  ulcerated or 70% or greater, we would recommend right carotid  endarterectomy. The procedure and potential complications were discussed  with the patient. All his questions were answered. He was agreeable to  proceed.   TECHNIQUE:  The patient was taken to the [facility] lab at [facility]. [facility] and the groins were prepped and draped in usual sterile fashion.  After the skin was anesthetized with 1% lidocaine the right common femoral  artery was cannulated and guidewire introduced into the infrarenal aorta  under fluoroscopic control. A 5-French sheath was passed over the wire and  the dilator was removed. A long pigtail catheter was positioned in the  ascending aortic arch and arch aortogram obtained in a 40  degrees LAO  projection. The catheter was then exchanged for an H1catheter which was  positioned into the innominate artery. The wire was advanced into the  proximal right common carotid artery and then the catheter advanced over the  wire. Selective right common carotid arteriogram was obtained with both  intracranial and extracranial views. The intracranial views will be dictated  separately by the neuroradiologist.   Next the catheter was retracted back into the aortic arch and positioned in  the left subclavian artery. Selective left subclavian arteriogram was  obtained. Next,  the H1 catheter was brought back into the aortic arch and  positioned into the left common carotid artery. The wire was advanced and  the catheter advanced over the wire into the proximal left common carotid  artery. Selective left common carotid arteriogram was obtained.   FINDINGS:  The arch is widely patent with no atherosclerotic disease or  stenoses identified. The innominate artery,  right subclavian, right  vertebral and right common carotid artery are all widely patent. By diameter  criteria, there is  a 43% right proximal internal carotid artery stenosis  over a length of approximately 1.5 to 2 cm. Distally, the artery is widely  patent. The external carotid artery has mild narrowing which is smooth also.  The stenosis in the internal carotid artery is also very smooth.   On the left side there is no significant stenosis of the common carotid  artery, internal our external carotid arteries. Likewise the left subclavian  artery is widely patent as is the left for tibial artery.   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 No significant extracranial carotid artery disease on the left.      CSD/MEDQ  D:  04/21/2004  T:  04/21/2004  Job:  [id number]   cc:   Harl Bowie, M.D.  [address]  [place]  [place] [address]  Fax: [phone]

## 2010-08-01 NOTE — Cardiovascular Report (Signed)
NAME:  [name], [name]                            ACCOUNT NO.:  [id number]   MEDICAL RECORD NO.:  [id number]                   PATIENT TYPE:  OIB   LOCATION:  6527                                 FACILITY:  [facility]   PHYSICIAN:  Veneda Melter, M.D. [facility]               DATE OF BIRTH:  [birth year]   DATE OF PROCEDURE:  05/30/2002  DATE OF DISCHARGE:  05/31/2002                              CARDIAC CATHETERIZATION   PROCEDURE PERFORMED:  1. Left heart catheterization.  2. Left ventriculogram.  3. Selective coronary angiography.  4. Percutaneous transluminal coronary angioplasty of the first diagonal     branch of the left anterior descending artery.  5. Intravascular ultrasound of the left anterior descending artery.  6. Matrix closure, right femoral artery.   DIAGNOSES:  1. Moderate 3-vessel coronary artery disease.  2. Normal left ventricular systolic function.   HISTORY:  The patient is a 70 year old gentleman who presents with crescendo  angina.  He has a known history of coronary artery disease and has  previously undergone cardiac catheterization showing moderate 3-vessel  disease although he had severe disease in a first diagonal branch.  He has  had well-preserved LV function with 2+ mitral regurgitation.  He has been  treated medically; however, recently with increased symptoms, he underwent a  stress imaging study.  The Cardiolite imaging showed no ischemia; however,  he had profound ST depression which persisted for 9 minutes.  He is referred  for further cardiac assessment.   TECHNIQUE:  Informed consent was obtained.  The patient was brought to the  catheterization lab.  A 6-French sheath was placed in the right femoral  artery using the modified Seldinger technique.  JL4 and JR4 6-French  catheters were then used to engage the left and right coronary arteries, and  selective angiography was performed in various projections using manual  injections of contrast.  A 6-French  pigtail catheter was then advanced to  the left ventricle, and left ventriculogram performed using power injections  of contrast.  Initial findings are as follows:   FINDINGS:  1. Left main trunk:  Medium caliber vessel with mild narrowings of 30% in     the mid section.  2. LAD:  This is a large caliber vessel that provides 2 diagonal branches in     the mid section.  The LAD has moderate narrowing of 30% in the proximal     segment.  There is then a long tubular narrowing of 60% in the mid     section encompassing the second diagonal branch.  The distal and apical     LAD has mild irregularities.  The first diagonal branch is a medium     caliber vessel with an ostial narrowing of 99%.  There is a further     narrowing of 90% in the proximal segment.  The second diagonal branch has  mild diffuse disease of 30%.  3. Left circumflex artery:  This is a medium caliber vessel that provides 2     marginal branches in the mid section.  The proximal AV circumflex has     moderate narrowing of 50%.  There is then a further lesion of 50%     involving the bifurcation of the first marginal branch.  4. Right coronary artery:  Dominant.  This is a medium caliber vessel that     provides a posterior descending artery and posterior ventricular branch     in the terminal segment.  The right coronary artery has tubular narrowing     of 50% in the proximal segment.  There is then moderate diffuse disease     in the mid section with narrowings of 50% to 60%.  The distal RCA has     narrowings of 30%.  5. LV:  Normal end systolic and end diastolic dimensions.  Overall left     ventricular function appears well-preserved with an ejection fraction of     greater than 55%.  Mitral regurgitation is difficult to assess due to     ectopy.   HEMODYNAMICS:  1. LV pressure is 120/5.  2. Aortic is 120/60.  3. LVEDP equals 15.   These findings were reviewed in detail with the patient and treatment  options  discussed including medical therapy versus percutaneous intervention  to the diagonal branch to see if this might relieve his symptoms.  As the  patient has had persistence of pain, he wished to proceed with percutaneous  intervention.   INTERVENTIONAL PROCEDURE:  The 6-French sheath was exchanged for a 7-French  sheath in the right groin, and a 7-French JL4 guide catheter was used to  engage the left coronary artery.  The patient was given Angiomax on a weight-  adjusted basis, and a 0.014-inch Forte wire advanced into the distal section  of the first diagonal branch.  A 2.25 x 10-mm cutting balloon was introduced  but could not be passed through the ostial lesion, and a 2.5 x 10-mm  CrossSail balloon introduced.  A single inflation was performed in the  proximal segment of the vessel at 8 atmospheres for 30 seconds, and 2  inflations in the ostium at 10 atmospheres for 30 and 60 seconds.  Repeat  angiography shows significant vessel recoil at the ostium, and the cutting  balloon was then reintroduced.  An inflation was then performed in the  proximal segment of the diagonal branch at 8 atmospheres for 30 seconds, and  2 inflations at the ostium at 8 atmospheres for 30 seconds.  Repeat  angiography showed significant improvement in the proximal lesion with  approximately 10% residual narrowing; however, there was persistence of  recoil and plaque shift at the ostium with residual narrowing of 70%,  although this did seem mildly improved from the beginning of the case.  The  patient, unfortunately, did not have significant chest discomfort with  balloon inflations.  The wire was then repositioned in the LAD, and  intravascular ultrasound of the LAD performed using automated pullback.  Repeat angiography showed no evidence of vessel damage.  Intravascular  ultrasound showed the distal LAD to be a large caliber vessel of 3 mm with the proximal LAD near the takeoff of the first diagonal  branch of  approximately 3.3 mm.  There was diffuse disease in the mid and distal  sections involving the second diagonal branch which narrowed the lumen to  approximately 2 mm.   Following removal of the guide catheter, a Matrix closure device was  deployed to the right femoral artery, and adequate hemostasis was achieved.  The patient tolerated the procedure well and was transferred to the floor in  stable condition.   FINAL RESULTS:  Successful percutaneous transluminal coronary angioplasty of  the first diagonal branch of the left anterior descending artery with  reduction in 95% and 80% narrowings to 70% and 10.   ASSESSMENT AND PLAN:  The patient is a 70 year old gentleman with moderate 3-  vessel coronary disease.  The lesion severity does not appear to have  advanced significantly from his prior catheterization in June 2000; however,  symptoms have worsened.  The patient will be stabilized medically, and I  believe that further assessment with stress  or dobutamine echocardiogram may be useful to determine if the patient has  ischemia, most notably in the anterior wall.  Should this be the case,  percutaneous intervention may be considered.  Otherwise, continued medical  therapy will be pursued.                                               Veneda Melter, M.D. [facility]    NG/MEDQ  D:  05/30/2002  T:  05/31/2002  Job:  [id number]   cc:   Britt Boozer Dr. Viann Shove, [facility], M.D.  [address]  [place]  [place] [address]  Fax: [phone]

## 2010-08-01 NOTE — H&P (Signed)
NAME:  [name], [name]                ACCOUNT NO.:  [id number]   MEDICAL RECORD NO.:  [id number]          PATIENT TYPE:  OIB   LOCATION:  2855                         FACILITY:  [facility]   PHYSICIAN:  Arturo Morton. Riley Kill, M.D. [facility] OF BIRTH:  April 19, 1940   DATE OF ADMISSION:  08/05/2004  DATE OF DISCHARGE:                                HISTORY & PHYSICAL   CHIEF COMPLAINT:  Chest pain, history of coronary artery disease.   HISTORY OF PRESENT ILLNESS:  [name] is a 70 year old male with a  history of coronary artery disease.  He had percutaneous intervention to the  first diagonal in March 2004. At that time he had moderate three-vessel  disease and EF of greater than 55%.  He is followed closely by Dr. Sherlyn Lick.   He saw Dr. Sherlyn Lick in April 2006 and was having recurrent substernal chest  pressure.  He got partial relief with sublingual nitroglycerin but had some  nausea and weakness.  He was admitted by Dr. Sherlyn Lick in April 2006 and had a  Cardiolite at that time.  The Cardiolite results were negative for ischemia  with an EF of 40 to 50%.  He had some upsloping ST depression with exercise  but no chest pain.  He had good exercise tolerance.  Dr. Sherlyn Lick felt that if  he had further symptoms, cardiac catheterization was indicated.  The patient  has continued to have occasional episodes of substernal chest tightness.  He  is referred today for catheterization.  He has had no reset pain and is pain  free at the time of exam.   PAST MEDICAL HISTORY:  1.  Cardiac catheterization in March 2004 with 60% LAD, 50% circumflex, and      80% diagonal which was treated with PTCA.  His left ventricular systolic      function was normal.  He had a distal 95% LAD for which medical therapy      was recommended.  2.  Hypertension.  3.  Hyperlipidemia.  4.  Questionable TIA in 2006.  5.  Peripheral vascular disease with a 43% left ICA stenosis by angiogram.  6.  History of mitral valve prolapse and  mitral regurgitation.  7.  Remote history of seizures, medication discontinued years ago with no      recent problems.  8.  History of BPH and erectile dysfunction.  9.  Remote history of peptic ulcer disease.   PAST SURGICAL HISTORY:  1.  Cardiac catheterization x 2.  2.  Carotid angiogram.  3.  Remote appendectomy.   SOCIAL HISTORY:  [place]  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  He has no  history of alcohol, tobacco, or drug abuse.   FAMILY HISTORY:  His mother died at age 62 fairly suddenly but without any  known coronary artery disease.  His father died at age 76 of renal failure  but had bypass surgery in his 56s.  He has no brothers or sisters with heart  disease.  ALLERGIES:  No known drug allergies.   MEDICATIONS:  1.  Lipitor alternating 20 mg and 40 mg a day.  2.  Norvasc 5 mg a day.  3.  Aspirin 325 mg daily.  4.  Flomax 0.4 mg daily.  5.  VasoCare 5 mg every other day.  6.  Zinc daily.  7.  Tranxene 3.75 mg p.r.n.  8.  Sublingual nitroglycerin p.r.n.   REVIEW OF SYSTEMS:  Significant for recent fatigue.  He denies any  hematemesis, hemoptysis, melena, or any kind of reflux symptoms.  He has  occasional arthralgias.  He has had no recent fevers, chills, or sweats.  He  has chest pain that is associated with dyspnea and some nausea as described  above.  He has no cough or wheeze.  Review of Systems is otherwise negative.   PHYSICAL EXAMINATION:  VITAL SIGNS:  Temperature 97.3, blood pressure  136/71, pulse 62, respiratory rate 16, O2 saturation 96% on room air.  GENERAL:  He is a well-developed, well-nourished white male in no acute  distress.  HEENT:  His head is normocephalic and atraumatic with pupils equal, round,  and reactive to light and accommodation.  Extraocular movements are intact.  Sclerae clear.  Nares without discharge.  NECK:  There is no lymphadenopathy,  thyromegaly, or JVD noted.  He has  bilateral carotid bruits.  CARDIOVASCULAR:  Heart is regular rate and rhythm with S1, S2, and a 2/6  systolic ejection murmur best heard at the left lower sternal border and  apex.  LUNGS:  Clear to auscultation bilaterally.  SKIN:  No rashes or lesions are noted.  ABDOMEN:  Soft and nontender with active bowel sounds and no  hepatosplenomegaly by palpation.  EXTREMITIES:  No cyanosis, clubbing, or edema.  MUSCULOSKELETAL:  No joint deformity or effusion.  No spinal or CVA  tenderness.  NEUROLOGIC:  Alert and oriented. Cranial nerves II-XII grossly intact.   EKG:  Moderate T wave changes from 2004 in the lateral leads.  He is in  sinus rhythm with a rate of 64.   Laboratory values are pending at the time of dictation.   ASSESSMENT AND PLAN:  1.  Chest tightness:  Symptoms are concerning for anginal pain, and he is      here today for catheterization.  Further evaluation and treatment will      depend on the results.  2.  The patient is otherwise stable and continued on same medications.  He      is followed closely by Dr. Sherlyn Lick for his other medical problems.   This is Theodore Demark, P.A.-C. dictating for Dr. Bonnee Quin who determined  the plan of care.      RB/MEDQ  D:  08/05/2004  T:  08/05/2004  Job:  [id number]   cc:   Harl Bowie, M.D.  [address]  [place]  [place] [address]  Fax: [phone]

## 2010-08-01 NOTE — Op Note (Signed)
NAME:  [name], [name]                ACCOUNT NO.:  [id number]   MEDICAL RECORD NO.:  [id number]          PATIENT TYPE:  INP   LOCATION:  2306                         FACILITY:  [facility]   PHYSICIAN:  Evelene Croon, M.D.     DATE OF BIRTH:  09-16-1940   DATE OF PROCEDURE:  08/14/2004  DATE OF DISCHARGE:                                 OPERATIVE REPORT   PREOPERATIVE DIAGNOSIS:  Severe three-vessel coronary artery disease.   POSTOPERATIVE DIAGNOSIS:  Severe three-vessel coronary artery disease.   OPERATIVE PROCEDURE:  Median sternotomy, extracorporeal circulation,  coronary artery bypass graft surgery x five using a left internal mammary  artery graft to the left anterior descending coronary artery, with a  saphenous vein graft to the second diagonal branch of the LAD, a sequential  saphenous vein graft to the first diagonal branch of the LAD and the obtuse  marginal branch of the left circumflex coronary artery, and a saphenous vein  graft to the posterior descending coronary artery, and endoscopic vein  harvesting from the right and left thigh.   ATTENDING SURGEON:  Evelene Croon, M.D.   ASSISTANT:  Ammie Ferrier, PA-C.   ANESTHESIA:  General endotracheal.   CLINICAL HISTORY:  This patient is a 70 year old gentleman who reported a  seventh month history of intermittent substernal chest pressure, shortness  of breath, and profound weakness associated with exertion. He has a history  of three-vessel coronary artery disease, treated by percutaneous  intervention of the small first diagonal branch in March of 2004. He has  been followed medically by Dr. Sherlyn Lick. He was admitted briefly in April of  2006 for chest pain but apparently had a negative Cardiolite at that time  with an ejection fraction of 40-50%. He continued to have the symptoms and  underwent cardiac catheterization on Aug 05, 2004. The LAD had moderate  calcification of the proximal and mid vessel with 75% stenosis at the  ostium  of the LAD and diffuse 75% stenosis in the mid LAD extending across the  origin of the second diagonal branch. The first diagonal was small but  graftable and had a 90% ostial stenosis. The second diagonal branch was also  small but graftable and had a 50% mid vessel stenosis. The left circumflex  had 70% proximal stenosis and 75% mid vessel stenosis. There was a very  small first marginal branch and a large bifurcating second marginal branch.  The right coronary artery was a dominant vessel and had 60-70% stenosis  proximally and about 40% mid vessel stenosis. There is a large posterior  descending branch and two small posterolateral branches. There is about 30%  stenosis in the proximal portion of the posterior descending artery. Left  ventricular function was normal. After review of the angiogram, and  examination of the patient, it was felt that coronary artery bypass graft  surgery was the best treatment. I discussed the operative procedure with the  patient and his wife including alternatives, benefits, and risks including  bleeding, blood transfusion, infection, stroke, myocardial infarction, graft  failure, and death. They understood and agreed to proceed.  OPERATIVE PROCEDURE:  The patient was taken to the operating room and placed  on the table in the supine position. After induction of general endotracheal  anesthesia, a Foley catheter was placed into the bladder using sterile  technique. Then, the chest, abdomen and both lower extremities were prepped  and draped in the usual sterile manner. The chest was entered through a  median sternotomy incision and the pericardium opened in the midline.  Examination of the heart showed good ventricular contractility. The  ascending aorta had no palpable plaques in it.   Then, the left internal mammary artery was harvested from the chest wall  with a pedicle graft. This is a medium caliber vessel with excellent blood  flow  through it. At the same time, the greater saphenous vein was harvested  from the right thigh using endoscopic vein harvest technique. This vein was  of medium size and good quality. Below the immediate level, this vein was  somewhat smaller and, therefore, I decided to harvest a second segment of  greater saphenous vein from the left thigh using endoscopic vein harvest  technique. This was of medium size and good quality.   Then, the patient was heparinized and when an adequate clotting time was  achieved, the distal ascending aorta was cannulated using a 20-French aortic  cannula for arterial in flow. Venous outflow was achieved using a two-stage  venous cannula for the right atrial appendage. An antegrade cardioplegia and  vent cannula was inserted in the aortic root.   The patient was placed on cardiopulmonary bypass and distal coronaries  identified. The LAD was a large graftable vessel. The two diagonal branches  were both relatively small but graftable. The first marginal was a tiny  vessel that bifurcated proximally and both sub-branches were too small to  graft. The obtuse marginal was a large graftable vessel. The right coronary  artery was diffusely diseased. There was a large posterior descending branch  that was graftable.   Then, the aorta was crossclamped and 500 mL of cold blood antegrade  cardioplegia was administered in the aortic root with quick arrest of the  heart. Systemic hypothermia to 20 degrees centigrade and topical hypothermia  was used. A temperature probe was placed in the septum and inside the  pericardium.   The first distal anastomosis was performed of the posterior descending  coronary artery. The internal diameter was about 1.75 mm. Conduit used was  in the greater saphenous vein and the anastomosis performed in an end-to-  side manner using continuous 7-0 Prolene suture. Flow was noted through the  graft and it was excellent.  The second distal  anastomosis was performed of the first diagonal branch.  The internal diameter was about 1.6 mm. The conduit used was a second 37 mm  greater saphenous vein and the anastomosis performed in a sequential side-to-  side manner using continuous 7-0 Prolene suture. Flow was noted through the  graft and it was excellent.   The third distal anastomosis was performed of the obtuse marginal branch of  the left circumflex coronary artery. The internal diameter of this vessel  was about 2 mm. The conduit used was the same 37 mm greater saphenous vein  and the anastomosis performed in a sequential end-to-side manner using  continuous 7-0 Prolene suture. Flow was noted through the graft and was  excellent. Another dose of cardioplegia was given down vein grafts and in  the aortic root.   The fourth distal anastomosis was performed  in the second diagonal branch.  The internal diameter was about 1.5 mm distally. The conduit used was a 37  mm greater saphenous vein and the anastomosis was performed in an end-to-  side manner using continuous 8-0 Prolene suture.   The fifth distal anastomosis was performed in the mid portion of the left  anterior descending coronary artery. The internal diameter of this vessel  was about 2 mm.  The conduit used was the left internal mammary graft and  was brought through an opening in the left pericardium anterior to the  phrenic nerve. This was anastomosed to the LAD in an end-to-side manner  using continuous 8-0 Prolene suture. The pedicle was sutured to the  epicardium with 6-0 Prolene sutures to prevent rotation. Then, the clamp was  removed from the mammary pedicle. There was rapid warming of the ventricular  septum and return of spontaneous ventricular fibrillation. The crossclamp  was removed after a time of 63 minutes and the patient spontaneously  converted to sinus rhythm. He was rewarmed to 37 degrees centigrade.   Then, the partial occlusion clamp was  placed on the aortic root and the  three proximal vein graft anastomoses were performed in an end-to-side  manner using continuous 6-0 Prolene suture. The clamp was removed, the vein  graft was de-aired, and clamps removed from them. The proximal and distal  anastomoses appeared hemostatic and widely grafts satisfactory. Graft  markers were placed around the proximal anastomoses. Two temporary right  ventricular and right atrial pacing wires were placed and brought out  through the skin.   When the patient had rewarmed to 37 degrees centigrade, he was weaned from  cardiopulmonary bypass on inotropic agents. Total bypass time was 107  minutes. Cardiac function appeared excellent with a cardiac output of 4  liters per minute. Protamine was given and the venous and aortic catheters  were removed without difficulty. Hemostasis was achieved. Three chest tubes  were placed, two in the post pericardium, and one in the left pleural space, and one in the anterior mediastinum. The pericardium was then closed loosely  over the heart. The sternum was closed with #6 stainless steel wires. The  fascia was closed with continuous #1 Vicryl suture. The subcutaneous tissue  was closed with continuous 2-0 Vicryl, and the skin with 3-0 Vicryl  subcuticular closure. The lower extremity vein harvest site was closed in  layers in a similar manner. The sponge, needle, and instrument counts were  correct at the end of the procedure. Dry, sterile dressings were applied  over the incisions around the chest tubes which were hooked to Pleur-Evac  suction. The patient remained hemodynamically stable and was transferred to  the SICU in guarded but stable condition.       BB/MEDQ  D:  08/14/2004  T:  08/14/2004  Job:  [id number]   cc:   [facility]   Harl Bowie, M.D.  [address]  [place]  [place] [address]  Fax: [phone]

## 2010-08-01 NOTE — Discharge Summary (Signed)
NAMEGERALD, KUEHL                ACCOUNT NO.:  [id number]   MEDICAL RECORD NO.:  [id number]          PATIENT TYPE:  INP   LOCATION:  2035                         FACILITY:  [facility]   PHYSICIAN:  Evelene Croon, M.D.     DATE OF BIRTH:  01/11/1941   DATE OF ADMISSION:  08/14/2004  DATE OF DISCHARGE:                                 DISCHARGE SUMMARY   HISTORY OF PRESENT ILLNESS:  The patient is a 70 year old male referred to  Dr. Laneta Simmers, who reported a several-month history of intermittent substernal  chest pressure, shortness of breath and weakness associated with exertion.  He had a history of three-vessel coronary disease treated with percutaneous  intervention of a small first diagonal branch in March 2004.  He has been  followed medically by Dr. Sherlyn Lick and was admitted briefly in April 2006 for  chest pain.  He apparently had a negative Cardiolite at that time with an  ejection fraction of 40-50%.  He had no chest pain during the procedure.  He  continued to have symptoms and therefore underwent repeat cardiac  catheterization on Aug 05, 2004.  The LAD had moderate calcification in the  proximal to midvessel with a 75% stenosis at the ostium of the LAD and  diffuse 75% stenosis in the mid-LAD extending across the origin of the  second diagonal branch.  The first diagonal branch had a 90% ostial  stenosis, and the second diagonal branch had a 50% midvessel stenosis.  The  left circumflex had a 70% proximal stenosis and 75% midvessel stenosis.  There was a small first marginal and then a large bifurcating second  marginal branch.  The right coronary artery was a dominant vessel that had  60-70% proximal and a 40% midvessel stenosis.  There was a large posterior  descending branch and two small posterolateral branches.  There was about a  30% stenosis of the proximal portion of the posterior descending artery.  Left ventricular function was normal.  He was seen and evaluated by Dr.  Laneta Simmers and felt to be a candidate for surgical revascularization, and he was  admitted this hospitalization for the procedure.   PAST MEDICAL HISTORY:  1.  Coronary artery disease as mentioned above.  He is status post      percutaneous intervention on a diagonal branch in March 2004.  2.  He has a history of hypertension and hyperlipidemia.  3.  He has a history of cerebrovascular disease.  4.  He has a remote history of seizure disorder, but the medication was      discontinued many years ago without recurrent problems.  5.  He has a history of benign prostatic hyperplasia.  6.  He has a history of peptic ulcer disease.  7.  He is status post appendectomy and status post surgery on his right hand      for a contracture.   ALLERGIES:  None.   MEDICATIONS PRIOR TO ADMISSION:  1.  Lipitor 20 mg alternating with 40 mg daily.  2.  Norvasc 5 mg daily.  3.  Aspirin 325 mg daily.  4.  Flomax 0.4 mg daily.  5.  VasoCare 5 mg every other day.  6.  Tranxene 3.75 mg p.r.n.  7.  Sublingual nitroglycerin.  8.  Zinc daily, dosage not known.   FAMILY HISTORY:  Strongly positive for coronary disease.  His father died at  age 52 and had bypass surgery in his 91s.  His mother died suddenly at age  80, and they believe this was related to coronary disease.   SOCIAL HISTORY:  He denies smoking, and no history of alcohol or drug abuse.   PHYSICAL EXAMINATION:  Please see the history and physical done at the time  of admission.   HOSPITAL COURSE:  The patient was admitted electively and on August 14, 2004,  taken to the operating room, where he underwent the following procedure:  Coronary artery bypass grafting x5.  The following grafts were placed:  Left  internal mammary artery to the LAD coronary artery; a saphenous vein graft  to the second diagonal branch of the LAD; a sequential saphenous vein graft  to the first diagonal branch of the LAD and the obtuse marginal  sequentially; and finally a  saphenous vein graft to the posterior descending  coronary artery.  The procedure was performed by Evelene Croon, M.D.  The  patient tolerated it well and was taken to the surgical intensive care unit  in stable condition.   Postoperative hospital course:  The patient has done quite well.  He has  maintained stable hemodynamics.  He initially did require some DDD pacing,  but he was weaned.  Additionally, the patient did have an episode of atrial  fibrillation with rapid ventricular response.  He was treated with oral beta  blocker as well as intravenous amiodarone.  He was chemically cardioverted  to a normal sinus rhythm and has been stable for greater than 48 hours in  sinus rhythm.  His amiodarone has been converted to p.o.  He has also had  some hypokalemia, but this has been replaced and his electrolytes are all  now within normal limits.  His BUN and creatinine are stable at 11 and 1.0.  He does have a moderate postoperative anemia.  Most recent hemoglobin and  hematocrit dated August 16, 2004, are 9.8 and 28.2, respectively.  He is  tolerating this clinically.  He is advancing in a routine manner regarding  cardiac rehabilitation phase one modalities.  Incisions are healing well  without evidence of infection.  He has been weaned from oxygen and maintains  good saturations on room air.  He is afebrile and overall tentatively felt  to be stable for discharge in the morning of August 18, 2004, pending morning  round reevaluation.   MEDICATIONS ON DISCHARGE:  1.  Aspirin 325 mg daily.  2.  Lopressor 25 mg twice daily.  3.  Lipitor 40 mg daily.  4.  Amiodarone 400 mg twice daily for an additional seven days and then 400      mg once daily.  5.  Flomax 0.4 mg daily.  6.  For pain, Tylox one or two every four to six hours as needed.   INSTRUCTIONS:  The patient received written instructions regarding medications, activity, diet, wound care and follow-up.  Follow-up will  include Dr.  Sherlyn Lick, two weeks; Dr. Laneta Simmers, three weeks.   CONDITION ON DISCHARGE:  Stable and improved.   FINAL DIAGNOSES:  Severe multivessel coronary artery disease, now status  post surgical revascularization as described above.   Other diagnoses  include as previously listed:  1.  Hypertension.  2.  Hyperlipidemia.  3.  History of cerebrovascular disease.  4.  Remote history of seizure disorder.  5.  History of benign prostatic hyperplasia.  6.  History of peptic ulcer disease.  7.  Previous surgeries as listed.       WEG/MEDQ  D:  08/17/2004  T:  08/18/2004  Job:  [id number]   cc:   Harl Bowie, M.D.  [address]  [place]  [place] [address]  Fax: [phone]

## 2010-08-01 NOTE — Consult Note (Signed)
NAME:  [name], [name] NO.:  [id number]   MEDICAL RECORD NO.:  [id number]          PATIENT TYPE:  OIB   LOCATION:  2899                         FACILITY:  [facility]   PHYSICIAN:  Dorothea Glassman., M.D.  DATE OF BIRTH:  [birth year]   DATE OF CONSULTATION:  04/21/2004  DATE OF DISCHARGE:                                   CONSULTATION   The procedure was performed by Dr. Waverly Ferrari.   RIGHT CAROTID ANGIOGRAPHY (INTRACRANIAL ONLY):  AP and lateral views of the  intracranial circulation were performed during injection of the right common  carotid artery.  There is no intracranial stenosis.  There is filling of the  anterior middle cerebral arteries bilaterally.  There is no aneurysm.   LEFT CAROTID ANGIOGRAPHY (INTRACRANIAL ONLY):  There is normal filling of  the anterior middle cerebral arteries with some cross filling to the right  anterior cerebral artery.  There is no aneurysm or intracranial stenosis or  occlusion.   The posterior circulation was not visualized in the intracranial  compartment.   IMPRESSION:  Negative bilateral intracranial carotid angiography.      DC/MEDQ  D:  04/21/2004  T:  04/21/2004  Job:  [id number]

## 2010-09-01 ENCOUNTER — Encounter: Payer: Self-pay | Admitting: Cardiovascular Disease

## 2010-09-04 ENCOUNTER — Encounter: Payer: Self-pay | Admitting: Cardiovascular Disease

## 2010-09-04 ENCOUNTER — Ambulatory Visit (INDEPENDENT_AMBULATORY_CARE_PROVIDER_SITE_OTHER): Payer: 59 | Admitting: Cardiovascular Disease

## 2010-09-04 DIAGNOSIS — I779 Disorder of arteries and arterioles, unspecified: Secondary | ICD-10-CM

## 2010-09-04 DIAGNOSIS — E785 Hyperlipidemia, unspecified: Secondary | ICD-10-CM | POA: Insufficient documentation

## 2010-09-04 DIAGNOSIS — I25810 Atherosclerosis of coronary artery bypass graft(s) without angina pectoris: Secondary | ICD-10-CM

## 2010-09-04 MED ORDER — ATORVASTATIN CALCIUM 40 MG PO TABS: 40.0000 mg | ORAL_TABLET | Freq: Every day | ORAL | Status: DC

## 2010-09-04 MED ORDER — ESOMEPRAZOLE MAGNESIUM 40 MG PO CPDR: 40.0000 mg | DELAYED_RELEASE_CAPSULE | Freq: Every day | ORAL | Status: DC

## 2010-09-04 NOTE — Assessment & Plan Note (Addendum)
The patient is doing well with no symptoms suggestive of angina. Most recent stress test was in July of 2011 which showed no clear evidence of ischemia with normal ejection fraction. Continue aspirin daily. His blood pressure is slightly elevated. This was rechecked manually and it was 140/78. We'll have him come for a blood pressure check to see if treatment is needed. Would consider adding an ARB.

## 2010-09-04 NOTE — Progress Notes (Signed)
HPI  This is a 70 year old man who is here today for followup visit. He has known history of coronary artery disease status post coronary artery bypass graft surgery in 2006. He also has moderate asymptomatic right carotid stenosis as well as recurrent syncope without documented arrhythmia on loop recorder. The patient has been doing very well overall without reported chest pain, dyspnea or palpitations. He has not had any syncope or presyncope.  No Known Allergies   Current Outpatient Prescriptions on File Prior to Visit  Medication Sig Dispense Refill  . aspirin 325 MG EC tablet Take 325 mg by mouth daily.        . Clorazepate Dipotassium (TRANXENE-SD) 11.25 MG TB24 Take 11.25 mg by mouth as needed. Take 1/2 tablet as needed        . escitalopram (LEXAPRO) 10 MG tablet Take 10 mg by mouth daily.        . Multiple Vitamin (MULTIVITAMIN) capsule Take 1 capsule by mouth daily.        . nitroGLYCERIN (NITROSTAT) 0.4 MG SL tablet Place 0.4 mg under the tongue every 5 (five) minutes as needed.        . tadalafil (CIALIS) 10 MG tablet Take 10 mg by mouth daily as needed.        . Tamsulosin HCl (FLOMAX) 0.4 MG CAPS Take 0.4 mg by mouth daily.        Marland Kitchen DISCONTD: atorvastatin (LIPITOR) 40 MG tablet Take 40 mg by mouth daily.        Marland Kitchen DISCONTD: esomeprazole (NEXIUM) 40 MG capsule Take 40 mg by mouth daily before breakfast.           Past Medical History  Diagnosis Date  . Coronary artery disease   . Sleep apnea   . Memory loss   . Heart murmur   . Syncope and collapse     No arrhythmia on loop recorder. Likely orthostatic hypotension  . Carotid artery disease     on the right side -60-79%  . Hyperlipidemia      Past Surgical History  Procedure Date  . Coronary artery bypass graft 2006  . Cardiac catheterization 2010    patent LIMA to LAD, SVG to D1, SVG to OM3, occluded SVG to a small D2. Normal EF     History reviewed. No pertinent family history.   History   Social History    . Marital Status: Married    Spouse Name: N/A    Number of Children: N/A  . Years of Education: N/A   Occupational History  . Not on file.   Social History Main Topics  . Smoking status: Never Smoker   . Smokeless tobacco: Not on file  . Alcohol Use: No  . Drug Use: No  . Sexually Active: Not on file   Other Topics Concern  . Not on file   Social History Narrative  . No narrative on file      PHYSICAL EXAM   BP 152/78  Pulse 64  Ht 5\' 8"  (1.727 m)  Wt 172 lb (78.019 kg)  BMI 26.15 kg/m2  SpO2 94%  Constitutional: He is oriented to person, place, and time. He appears well-developed and well-nourished. No distress.  HENT: No nasal discharge.  Head: Normocephalic and atraumatic.  Eyes: Pupils are equal, round, and reactive to light. Right eye exhibits no discharge. Left eye exhibits no discharge.  Neck: Normal range of motion. Neck supple. No JVD present. No thyromegaly present.  Cardiovascular: Normal rate,  regular rhythm, normal heart sounds and intact distal pulses. Exam reveals no gallop and no friction rub.  Visit 2/6 systolic ejection murmur in the aortic area Pulmonary/Chest: Effort normal and breath sounds normal. No stridor. No respiratory distress. He has no wheezes. He has no rales. He exhibits no tenderness.  Abdominal: Soft. Bowel sounds are normal. He exhibits no distension. There is no tenderness. There is no rebound and no guarding.  Musculoskeletal: Normal range of motion. He exhibits no edema and no tenderness.  Neurological: He is alert and oriented to person, place, and time. Coordination normal.  Skin: Skin is warm and dry. No rash noted. He is not diaphoretic. No erythema. No pallor.  Psychiatric: He has a normal mood and affect. His behavior is normal. Judgment and thought content normal.        ASSESSMENT AND PLAN

## 2010-09-04 NOTE — Patient Instructions (Signed)
Your physician recommends that you schedule a follow-up appointment in: 9 months  

## 2010-09-04 NOTE — Assessment & Plan Note (Signed)
Most recent lipid profile showed a total cholesterol of 151, triglyceride 41, HDL 55, LDL 68. Continue current treatment with atorvastatin 40 mg once daily. His lipid profile was optimal.

## 2010-09-04 NOTE — Assessment & Plan Note (Signed)
This is asymptomatic and being monitored every 6 months a carotid ultrasound Doppler.

## 2010-10-22 ENCOUNTER — Ambulatory Visit (HOSPITAL_BASED_OUTPATIENT_CLINIC_OR_DEPARTMENT_OTHER)
Admission: RE | Admit: 2010-10-22 | Discharge: 2010-10-22 | Disposition: A | Discharge status: Home / Self Care | Payer: 59 | Source: Ambulatory Visit | Attending: Orthopedic Surgery | Admitting: Orthopedic Surgery

## 2010-10-22 DIAGNOSIS — M19019 Primary osteoarthritis, unspecified shoulder: Secondary | ICD-10-CM | POA: Insufficient documentation

## 2010-10-22 DIAGNOSIS — Z01812 Encounter for preprocedural laboratory examination: Secondary | ICD-10-CM | POA: Insufficient documentation

## 2010-10-22 DIAGNOSIS — G4733 Obstructive sleep apnea (adult) (pediatric): Secondary | ICD-10-CM | POA: Insufficient documentation

## 2010-10-22 DIAGNOSIS — M25819 Other specified joint disorders, unspecified shoulder: Secondary | ICD-10-CM | POA: Insufficient documentation

## 2010-10-22 DIAGNOSIS — I2510 Atherosclerotic heart disease of native coronary artery without angina pectoris: Secondary | ICD-10-CM | POA: Insufficient documentation

## 2010-10-22 LAB — POCT I-STAT, CHEM 8
BUN: 11 mg/dL (ref 6–23)
Chloride: 107 mEq/L (ref 96–112)
Creatinine, Ser: 0.8 mg/dL (ref 0.50–1.35)
Glucose, Bld: 90 mg/dL (ref 70–99)
HCT: 47 % (ref 39.0–52.0)
Hemoglobin: 16 g/dL (ref 13.0–17.0)
Potassium: 4.2 mEq/L (ref 3.5–5.1)
TCO2: 25 mmol/L (ref 0–100)

## 2010-10-31 NOTE — Op Note (Addendum)
  NAME:  [name], [name]                ACCOUNT NO.:  [id number]  MEDICAL RECORD NO.:  [id number]  LOCATION:  [facility]                         FACILITY:  [facility]  PHYSICIAN:  Mila Homer. Sherlean Foot, M.D. DATE OF BIRTH:  Oct 14, 1940  DATE OF PROCEDURE:  10/22/2010 DATE OF DISCHARGE:  06/20/2010                              OPERATIVE REPORT   SURGEON:  Mila Homer. Sherlean Foot, MD  ASSISTANT:  Altamese Cabal, PA-C  ANESTHESIA:  General.  PREOPERATIVE DIAGNOSES:  Left shoulder impingement syndrome, acromioclavicular joint arthritis.  POSTOPERATIVE DIAGNOSES:  Left shoulder impingement syndrome, acromioclavicular joint arthritis.  PROCEDURES:  Left shoulder arthroscopy, subacromial decompression, distal clavicle resection, glenohumeral debridement.  INDICATIONS FOR PROCEDURE:  The patient is a 70 year old white male with failure of conservative measures and MRI evidence of impingement syndrome.  Informed consent obtained.  DESCRIPTION OF PROCEDURE:  The patient was laid supine, administered general anesthesia, and placed in the beach-chair position.  Left shoulder prepped and draped in the usual sterile fashion. Anteroposterior direct lateral portals were created with a #11 blade, blunt trocar, and cannula.  Diagnostic arthroscopy revealed no chondromalacia in joints of degenerative labral tearing which is cleaned up Automatic [employer] shaver.  I then went to the subacromial space from the posterior portal and performed bursectomy.  I then evaluated the acromion anatomy.  There was significant impingement and partial- thickness rotator cuff tearing.  Following on 3 or 4-mm of clearance with the anterolateral corner.  I then released CA ligament with the ArthroCare debridement wand.  I then used 4.0-mm cylindrical bur to perform a acromioplasty of this palpable resection.  At this point, I evacuated the shoulder, closed with 4-0 nylon sutures.  Dressed with Xeroform and a shoulder dressing.  COMPLICATIONS:   None.  DRAINS:  None.         ______________________________ Mila Homer. Sherlean Foot, M.D.    SDL/MEDQ  D:  10/22/2010  T:  10/23/2010  Job:  [id number]  Electronically Signed by Georgena Spurling M.D. on 10/31/2010 02:38:45 PM

## 2010-12-17 ENCOUNTER — Encounter (INDEPENDENT_AMBULATORY_CARE_PROVIDER_SITE_OTHER): Payer: 59 | Admitting: Cardiology

## 2010-12-17 ENCOUNTER — Other Ambulatory Visit: Payer: Self-pay | Admitting: Cardiology

## 2010-12-17 DIAGNOSIS — I6529 Occlusion and stenosis of unspecified carotid artery: Secondary | ICD-10-CM

## 2011-02-20 ENCOUNTER — Encounter: Payer: Self-pay | Admitting: Neurology

## 2011-03-03 ENCOUNTER — Ambulatory Visit (INDEPENDENT_AMBULATORY_CARE_PROVIDER_SITE_OTHER): Payer: 59 | Admitting: Neurology

## 2011-03-03 ENCOUNTER — Encounter: Payer: Self-pay | Admitting: Neurology

## 2011-03-03 VITALS — BP 130/80 | HR 56 | Wt 179.0 lb

## 2011-03-03 DIAGNOSIS — R413 Other amnesia: Secondary | ICD-10-CM

## 2011-03-03 MED ORDER — ESCITALOPRAM OXALATE 20 MG PO TABS: 20.0000 mg | ORAL_TABLET | Freq: Every day | ORAL | Status: DC

## 2011-03-03 NOTE — Progress Notes (Signed)
Dear Dr. Shary Decamp,  Thank you for having me see [name] in consultation today at [facility] for his problem with memory loss and agitation.  As you may recall, he is a 70 y.o. year old male with a history asymptomatic right carotid stenosis, CAD who has a history of memory loss that has progressed since a CABGx5 where he was on prolonged pump time of 6 hours.  Since that time his memory has continue to worsen.  He frequently has to reask questions that were answered in the same day.  He has put items in the wrong place and is unable to find them afterwards.  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  He has not had an episode of getting lost while driving, but continues to travel only in familiar areas and does not trust himself to drive to [place] for example.  He saw Dr. Melbourne Abts, and was diagnosed with dementia.  Dr. Sandria Manly felt that [name] was depressed and placed him on Lexapro.  This was over a year ago, and initially they felt it helped spells of agitation, but now he is having more frequent spells of arguments with his wife.  The situation of these arguments is quite stereotyped.  It typically surrounds conflicts over time spent on Saturdays, which is supposed to be their time spent together.  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  When confronted with his inability to meet time commitments there is typically a heated argument, which his wife says can ruin the whole week.  She says he typically takes comments as a personal afront and "shuts down" and refuses to speak about it rationally.  The patient is using clorazepate more frequently to calm his agitation.  They are wondering whether an increase in his Lexapro would be helpful.  In the past he tried Aricept(which caused hand cramps), Excelon  as well as Namenda with no definite results.  He did have an MRI 5 years ago.  He has never had NP testing.  He does continue to endorse depression.  However, a GDS was 3/15.   Past Medical History  Diagnosis Date  . Coronary artery disease   . Sleep apnea   . Memory loss   . Heart murmur   . Syncope and collapse     No arrhythmia on loop recorder. Likely orthostatic hypotension  . Carotid artery disease     on the right side -60-79%  . Hyperlipidemia   - history of possible seizures - "years ago" - boxed as a young man. - no strokes -   Past Surgical History  Procedure Date  . Coronary artery bypass graft 2006  . Cardiac catheterization 2010    patent LIMA to LAD, SVG to D1, SVG to OM3, occluded SVG to a small D2. Normal EF  . Hand surgery     History   Social History  . Marital Status: Married    Spouse Name: N/A    Number of Children: N/A  . Years of Education: N/A   Social History Main Topics  . Smoking status: Never Smoker   . Smokeless tobacco: Never Used  . Alcohol Use: No  . Drug Use: No  . Sexually Active: None   Other Topics Concern  .  None   Social History Narrative  . None    No history of neurologic disorders.  Current Outpatient Prescriptions on File Prior to Visit  Medication Sig Dispense Refill  . aspirin 325 MG EC tablet Take 325 mg by mouth daily.        Marland Kitchen atorvastatin (LIPITOR) 40 MG tablet Take 1 tablet (40 mg total) by mouth daily.  90 tablet  3  . Clorazepate Dipotassium (TRANXENE-SD) 11.25 MG TB24 Take 11.25 mg by mouth as needed. Take 1/2 tablet as needed        . esomeprazole (NEXIUM) 40 MG capsule Take 1 capsule (40 mg total) by mouth daily before breakfast.  90 capsule  3  . Multiple Vitamin (MULTIVITAMIN) capsule Take 1 capsule by mouth daily.        . nitroGLYCERIN (NITROSTAT) 0.4 MG SL tablet Place 0.4 mg under the tongue every 5 (five) minutes as needed.        . Tamsulosin HCl (FLOMAX) 0.4 MG CAPS Take 0.4 mg by mouth daily.         . tadalafil (CIALIS) 10 MG tablet Take 10 mg by mouth daily as needed.          No Known Allergies    ROS:  13 systems were reviewed and are notable for no change in bladder habits, as well as no gait changes. No significant behavioral changes.  All other review of systems are unremarkable.   Examination:  Filed Vitals:   03/03/11 1525  BP: 130/80  Pulse: 56  Weight: 179 lb (81.194 kg)     In general, well appearing older man in NAD.  Cardiovascular: The patient has a regular rate and rhythm and no carotid bruits.  Fundoscopy:  Disks are flat. Vessel caliber within normal limits.  Mental status:   MMSE  2/5 place, 2/5 time, 1/5 WORLD, 0/3 recall, 1/3 command.  15/30 MMSE.  Cranial Nerves: Pupils are equally round and reactive to light. Visual fields full to confrontation. Extraocular movements are intact without nystagmus. Facial sensation and muscles of mastication are intact. Muscles of facial expression are symmetric. Hearing intact to bilateral finger rub. Tongue protrusion, uvula, palate midline.  Shoulder shrug intact  FRS: - PM, - snout, -glabellar.  Motor:  The patient has normal bulk and tone, no pronator drift.  There are no adventitious movements.  5/5 bilaterally.  Reflexes:   Biceps  Triceps Brachioradialis Knee Ankle  Right 2+  2+  2+   2+ 2+  Left  2+  2+  2+   2+ 2+  Toes down  Coordination:  Normal finger to nose.  No dysdiadokinesia.  Sensation is to light touch.  Graphesthesia normal.  No left right confusion.  Gait and Station are normal.   Romberg is negative   Impression/Recs: Dementia, likely Alzheimer's type, although given his history of worsening after CABG, there may be a vascular component.  There are not any treatment options left from a dementia medication perspective.  However, the clear issue is these episodes of agitation surrounding marital expectations re shared time together.  I think the reason he does not make it home  on time to spend time with his wife is because of his time disorientation.  I spent a significant amount of time talking about using some type of method of reminder to help him get home on time on Saturday so that there is less conflict about it as this issue is clearly impacting the quality of their life.  I suggested they work with their grandson, who is the person he spends his Saturdays with to help remind his grand father that he needs to get back home at a certain time.  In terms of the agitation, an increase in his Lexapro may be helpful so I have increased this to 20mg  daily.  I don't think the clorazepate is a good long term solution, given its propensity to exacerbate memory.  I would like him to get NP testing, also to see if our neuropsychologist Dr. Leonides Cave has suggestions about other ways in which he can improve his function by through memory aids.  I am going to get a repeat MRI brain to assess changes from his previous one.  The patient will have this done in [place] will send me both the old and new MRI for comparison.   We will see the patient back in 3 months.  Thank you for having [place] see [place] Cellar Montella in consultation.  Feel free to contact me with any questions.  Lupita Raider Modesto Charon, MD [facility], [place] [address] [place], [place] [address] Phone: [phone] Fax: [phone].

## 2011-03-03 NOTE — Patient Instructions (Signed)
Your MRI is scheduled for Thursday, Dec 20th at 9:15am.  Arrive by 8:45am  Dr. Leonides Cave will call you regarding your memory testing appointment.

## 2011-03-04 ENCOUNTER — Telehealth: Payer: Self-pay | Admitting: Neurology

## 2011-03-04 NOTE — Telephone Encounter (Addendum)
ROI Signed, Release faxed to [facility] to have Records sent to [facility]. 03/04/11/km  Records received from [facility] 03/06/11/km

## 2011-03-16 DIAGNOSIS — F068 Other specified mental disorders due to known physiological condition: Secondary | ICD-10-CM

## 2011-03-18 ENCOUNTER — Encounter: Payer: Self-pay | Admitting: Internal Medicine

## 2011-03-20 ENCOUNTER — Telehealth: Payer: Self-pay | Admitting: Neurology

## 2011-03-20 NOTE — Telephone Encounter (Signed)
Pt's wife called for results of MRI done on 03/05/2011.

## 2011-03-20 NOTE — Telephone Encounter (Signed)
Apologies to [name] for delay.   MRI brain looked pretty much unchanged from 5 years ago.  No obvious cause of the memory loss seen.  Jan - If you could let [name] know that would be great.

## 2011-03-23 ENCOUNTER — Telehealth: Payer: Self-pay | Admitting: Neurology

## 2011-03-23 NOTE — Telephone Encounter (Signed)
Called and spoke with the patient's wife. Information given as directed by Dr. Wong re: MRI stable as compared to the last one done 5 years ago. No other issues. 

## 2011-03-23 NOTE — Telephone Encounter (Signed)
Called and spoke with the patient's wife. Information given as directed by Dr. Modesto Charon re: MRI stable as compared to the last one done 5 years ago. No other issues.

## 2011-04-21 ENCOUNTER — Other Ambulatory Visit: Payer: Self-pay | Admitting: Neurology

## 2011-04-21 MED ORDER — ESCITALOPRAM OXALATE 20 MG PO TABS: 20.0000 mg | ORAL_TABLET | Freq: Every day | ORAL | Status: DC

## 2011-04-21 NOTE — Telephone Encounter (Signed)
yes

## 2011-04-21 NOTE — Telephone Encounter (Signed)
Pt's wife would like to know if she can have a 90 day supply on his lexapro scrip instead of a 30 day supply? They use the [facility] outpatient pharmacy.

## 2011-04-21 NOTE — Telephone Encounter (Signed)
#  90 of lexapro called to [facility].

## 2011-06-05 ENCOUNTER — Encounter: Payer: Self-pay | Admitting: Internal Medicine

## 2011-06-05 ENCOUNTER — Ambulatory Visit (INDEPENDENT_AMBULATORY_CARE_PROVIDER_SITE_OTHER): Payer: 59 | Admitting: Internal Medicine

## 2011-06-05 VITALS — BP 147/78 | HR 58 | Ht 68.0 in | Wt 181.0 lb

## 2011-06-05 DIAGNOSIS — I25810 Atherosclerosis of coronary artery bypass graft(s) without angina pectoris: Secondary | ICD-10-CM

## 2011-06-05 DIAGNOSIS — R55 Syncope and collapse: Secondary | ICD-10-CM

## 2011-06-05 NOTE — Progress Notes (Signed)
  HPI  [name] is a 71 y.o. male Seen in followup for syncope and palpitations. Is a previously implanted loop recorder which was explanted and a year ago.  His coronary artery disease with prior bypass grafting. Most recent Myoview 2011 demonstrated no ischemia and normal left ventricular function is   The patient denies chest pain, shortness of breath, nocturnal dyspnea, orthopnea or peripheral edema.  There have been no palpitations, lightheadedness or syncope.   Past Medical History  Diagnosis Date  . Coronary artery disease   . Sleep apnea   . Memory loss   . Heart murmur   . Syncope and collapse     No arrhythmia on loop recorder. Likely orthostatic hypotension  . Carotid artery disease     on the right side -60-79%  . Hyperlipidemia     Past Surgical History  Procedure Date  . Coronary artery bypass graft 2006  . Cardiac catheterization 2010    patent LIMA to LAD, SVG to D1, SVG to OM3, occluded SVG to a small D2. Normal EF  . Hand surgery     Current Outpatient Prescriptions  Medication Sig Dispense Refill  . aspirin 325 MG EC tablet Take 325 mg by mouth daily.        Marland Kitchen atorvastatin (LIPITOR) 40 MG tablet Take 1 tablet (40 mg total) by mouth daily.  90 tablet  3  . Clorazepate Dipotassium (TRANXENE-SD) 11.25 MG TB24 Take 11.25 mg by mouth as needed. Take 1/2 tablet as needed        . escitalopram (LEXAPRO) 20 MG tablet Take 1 tablet (20 mg total) by mouth daily.  90 tablet  1  . esomeprazole (NEXIUM) 40 MG capsule Take 1 capsule (40 mg total) by mouth daily before breakfast.  90 capsule  3  . Multiple Vitamin (MULTIVITAMIN) capsule Take 1 capsule by mouth daily.        . nitroGLYCERIN (NITROSTAT) 0.4 MG SL tablet Place 0.4 mg under the tongue every 5 (five) minutes as needed.        . Tamsulosin HCl (FLOMAX) 0.4 MG CAPS Take 0.4 mg by mouth daily.          No Known Allergies  Review of Systems negative except from HPI and PMH  Physical Exam BP 147/78   Pulse 58  Ht 5\' 8"  (1.727 m)  Wt 181 lb (82.101 kg)  BMI 27.52 kg/m2 Well developed and nourished in no acute distress HENT normal Neck supple with JVP-flat Clear Regular rate and rhythm, no murmurs or gallops Abd-soft with active BS No Clubbing cyanosis edema Skin-warm and dry and and Good distal pulses A & Oriented  Grossly normal sensory and motor function    Assessment and  Plan

## 2011-06-05 NOTE — Assessment & Plan Note (Signed)
No recurrent syncope 

## 2011-06-05 NOTE — Assessment & Plan Note (Signed)
Stable on current medications 

## 2011-06-09 ENCOUNTER — Encounter: Payer: Self-pay | Admitting: Neurology

## 2011-06-09 ENCOUNTER — Ambulatory Visit (INDEPENDENT_AMBULATORY_CARE_PROVIDER_SITE_OTHER): Payer: 59 | Admitting: Neurology

## 2011-06-09 VITALS — BP 110/68 | HR 72 | Ht 68.0 in

## 2011-06-09 DIAGNOSIS — F0390 Unspecified dementia without behavioral disturbance: Secondary | ICD-10-CM

## 2011-06-09 NOTE — Progress Notes (Signed)
  Dear Dr. Shary Decamp,  I saw  [place] Cellar Arney back in [facility] for his problem with dementia.  As you may recall, he is a 71 y.o. year old male with a history of progressive dementia that started after a 5 vessel CABG in 2006.  When I saw him last I felt he likely had Alzheimer's disease.  He was also having problems with agitation.  He had had abdominal cramps from Aricept, personality changes from Exelon, and no response to Namenda.  Due to his agitation I increased his Lexapro to 20mg .  This has helped with the patient not needing clorazepate since I last saw him.  Memory remains about the same.  No hallucinations, delusions, paranoia.  Still driving short distances without problems.  Sleeping well.  No change in bladder habits.    Medical history, social history, and family history were reviewed and have not changed since the last clinic visit.  Current Outpatient Prescriptions on File Prior to Visit  Medication Sig Dispense Refill  . aspirin 325 MG EC tablet Take 325 mg by mouth daily.        Marland Kitchen atorvastatin (LIPITOR) 40 MG tablet Take 1 tablet (40 mg total) by mouth daily.  90 tablet  3  . Clorazepate Dipotassium (TRANXENE-SD) 11.25 MG TB24 Take 11.25 mg by mouth as needed. Take 1/2 tablet as needed        . escitalopram (LEXAPRO) 20 MG tablet Take 1 tablet (20 mg total) by mouth daily.  90 tablet  1  . esomeprazole (NEXIUM) 40 MG capsule Take 1 capsule (40 mg total) by mouth daily before breakfast.  90 capsule  3  . Multiple Vitamin (MULTIVITAMIN) capsule Take 1 capsule by mouth daily.        . nitroGLYCERIN (NITROSTAT) 0.4 MG SL tablet Place 0.4 mg under the tongue every 5 (five) minutes as needed.        . Tamsulosin HCl (FLOMAX) 0.4 MG CAPS Take 0.4 mg by mouth daily.          No Known Allergies  ROS:  13 systems were reviewed and are unremarkable.  Exam: . Filed Vitals:   06/09/11 1500  BP: 110/68  Pulse: 72  Height: 5\' 8"  (1.727 m)      Impression/Recommendations:  1.  Probable Alzheimer's dementia - We will continue the Lexapro for his agitation.  I don't think a trial of galantamine is likely to be successful given that he did not tolerate Aricept.  The will inform me if he develops hallucination, delusions, paranoia or other behavioral disturbances.  We will see the patient back in 6 months.  Lupita Raider Modesto Charon, MD [facility], [place]

## 2011-08-13 ENCOUNTER — Other Ambulatory Visit: Payer: Self-pay | Admitting: *Deleted

## 2011-08-13 DIAGNOSIS — I6529 Occlusion and stenosis of unspecified carotid artery: Secondary | ICD-10-CM

## 2011-08-14 ENCOUNTER — Encounter (INDEPENDENT_AMBULATORY_CARE_PROVIDER_SITE_OTHER): Payer: 59

## 2011-08-14 DIAGNOSIS — I6529 Occlusion and stenosis of unspecified carotid artery: Secondary | ICD-10-CM

## 2011-08-25 ENCOUNTER — Telehealth: Payer: Self-pay | Admitting: *Deleted

## 2011-08-25 DIAGNOSIS — R002 Palpitations: Secondary | ICD-10-CM

## 2011-08-25 NOTE — Telephone Encounter (Signed)
The patient's wife notified Dr. Graciela Husbands today that has had episodes of weakness/palpitations/ and turning pale. Per Dr. Graciela Husbands, the patient should wear a 48 hour holter and follow up after.

## 2011-08-31 ENCOUNTER — Encounter (INDEPENDENT_AMBULATORY_CARE_PROVIDER_SITE_OTHER): Payer: 59

## 2011-08-31 DIAGNOSIS — R002 Palpitations: Secondary | ICD-10-CM

## 2011-09-08 ENCOUNTER — Telehealth: Payer: Self-pay | Admitting: *Deleted

## 2011-09-08 DIAGNOSIS — I493 Ventricular premature depolarization: Secondary | ICD-10-CM

## 2011-09-08 MED ORDER — AMLODIPINE BESYLATE 2.5 MG PO TABS: 2.5000 mg | ORAL_TABLET | Freq: Every day | ORAL | Status: DC

## 2011-09-08 NOTE — Telephone Encounter (Signed)
Per Dr. Graciela Husbands, he called and spoke with the patient/ his wife on Friday night (6/21) and reviewed his holter monitor with them. Per Dr. Graciela Husbands, there was discussion of starting the patient on propranolol 10 mg before lunch and supper, but due to bradycardia issues, he has opted to start the patient on norvasc 2.5 mg once daily before supper. The patient's wife is aware of this and verbalizes understanding. They request a prescription be sent to [facility].

## 2011-09-15 ENCOUNTER — Telehealth: Payer: Self-pay | Admitting: *Deleted

## 2011-09-15 DIAGNOSIS — I493 Ventricular premature depolarization: Secondary | ICD-10-CM

## 2011-09-15 MED ORDER — PROPRANOLOL HCL 10 MG PO TABS: ORAL_TABLET | ORAL | Status: DC

## 2011-09-15 NOTE — Telephone Encounter (Signed)
Per the patient's wife, he is not responding well to amlodipine. He is still having episodes of weakness and palpitations after eating meals. Per Dr. Graciela Husbands, d/c amlodipine and start inderal 10 mg at lunch and supper. The patient's wife is aware and voices understanding.

## 2011-11-20 ENCOUNTER — Telehealth: Payer: Self-pay | Admitting: Neurology

## 2011-11-20 NOTE — Telephone Encounter (Signed)
I think that it would be ok for him to see Dr. Arbutus Leas.  Dr. Arbutus Leas - Benign dementia patient whose wife is a Information systems manager.  If you are ok to see him that would be great.

## 2011-11-20 NOTE — Telephone Encounter (Signed)
Pt's wife called to see where pt should go once Dr. Modesto Charon leaves. He is currently being seen for dementia. Should he continue with you to [facility] or stay on here with Dr. Arbutus Leas? She also wonders if you have any further recommendations for doctors. The pt used to see Dr. Sandria Manly and does not want to go back there.

## 2011-11-23 NOTE — Telephone Encounter (Signed)
Pt is scheduled 12/10/2011 @ 1:30 with Dr. Arbutus Leas.

## 2011-11-23 NOTE — Telephone Encounter (Signed)
Okay with me, just make sure that Elmarie Shiley has enough time to do an MMSE on him before I come into the room (bring him in early).  Thanks!

## 2011-11-23 NOTE — Telephone Encounter (Signed)
[place] [place], can you please schedule with Dr. Arbutus Leas? Last seen in March and they were to f/u in 6 months. Per Dr. Arbutus Leas, they just need to come in 30 minutes early for a MMSE prior to appointment. Thank you.

## 2011-12-10 ENCOUNTER — Ambulatory Visit (INDEPENDENT_AMBULATORY_CARE_PROVIDER_SITE_OTHER): Payer: 59 | Admitting: Neurology

## 2011-12-10 ENCOUNTER — Encounter: Payer: Self-pay | Admitting: Neurology

## 2011-12-10 VITALS — BP 122/68 | HR 58 | Temp 97.6°F | Resp 16 | Wt 179.0 lb

## 2011-12-10 DIAGNOSIS — F0280 Dementia in other diseases classified elsewhere without behavioral disturbance: Secondary | ICD-10-CM

## 2011-12-10 DIAGNOSIS — G309 Alzheimer's disease, unspecified: Secondary | ICD-10-CM

## 2011-12-10 MED ORDER — GALANTAMINE HYDROBROMIDE ER 8 MG PO CP24: 8.0000 mg | ORAL_CAPSULE | Freq: Every day | ORAL | Status: DC

## 2011-12-10 NOTE — Progress Notes (Signed)
The patient is seen in neurologic follow up at the request of Dr. Modesto Charon for the evaluation of memory.  The patient is accompanied by his wife who supplements the history.  I reviewed the notes that were previously made available to me, since Dr. Modesto Charon is no longer at the practice.  The patient is a 71 y.o. year old male who has had memory issues for about 7 years, since hisCABG in 2006.  The patient does not do the finances in the home.  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  The patient does drive. He is able to get to familiar places but he would not be able to get to unfamiliar places with directions.  The farthest he drives is 15 min from home.   There have not been any motor vehicle accidents in the recent years.  The patient does not cook.  They generally eat out.      The patient is  able to perform his own ADL's.  The patient is not able to distribute his own medications.  His wife has done his pill box preparation for at least 11 years, which is how long they have been married.  She thinks that he would not know to take medications without her.  The patients bladder and bowel are  under good control.  There have been some behavioral changes over the years.  The initatiation of Lexapro has helped the easy agitation. There have been no hallucinations.  He always recognizes his family  Previous medications:  Aricept (cramps in hands)                                       Exelon patch (personality change)                                       Namenda (d/c secondary to "no help")                                       Lexapro helps agitation  The patient has OSA but is noncompliant with CPAP.  He snores on his back.  He quit using it about 1 1/2 years ago.    No Known Allergies  Current Outpatient Prescriptions on File Prior to Visit  Medication Sig Dispense Refill  . aspirin 325 MG EC tablet Take 325 mg by mouth  daily.        Marland Kitchen atorvastatin (LIPITOR) 40 MG tablet Take 1 tablet (40 mg total) by mouth daily.  90 tablet  3  . Clorazepate Dipotassium (TRANXENE-SD) 11.25 MG TB24 Take 11.25 mg by mouth as needed. Take 1/2 tablet as needed        . escitalopram (LEXAPRO) 20 MG tablet Take 1 tablet (20 mg total) by mouth daily.  90 tablet  1  . esomeprazole (NEXIUM) 40 MG capsule Take 1 capsule (40 mg total) by mouth daily before breakfast.  90 capsule  3  . Multiple Vitamin (MULTIVITAMIN) capsule Take 1 capsule by mouth daily.        . nitroGLYCERIN (NITROSTAT) 0.4 MG SL tablet Place 0.4 mg under the  tongue every 5 (five) minutes as needed.        . propranolol (INDERAL) 10 MG tablet Take one tablet by mouth at lunch and supper  60 tablet  6  . Tamsulosin HCl (FLOMAX) 0.4 MG CAPS Take 0.4 mg by mouth daily.        Marland Kitchen DISCONTD: tadalafil (CIALIS) 10 MG tablet Take 10 mg by mouth daily as needed.          Past Medical History  Diagnosis Date  . Coronary artery disease     prior bypass grafting. Most recent Myoview 2011 demonstrated no ischemia and normal left ventricular function and if you or a mean and a 1 and a for a to a he is a for a  . Sleep apnea   . Memory loss   . Heart murmur   . Syncope and collapse     No arrhythmia on loop recorder. Likely orthostatic hypotension  . Carotid artery disease     on the right side -60-79%  . Hyperlipidemia     Past Surgical History  Procedure Date  . Coronary artery bypass graft 2006  . Cardiac catheterization 2010    patent LIMA to LAD, SVG to D1, SVG to OM3, occluded SVG to a small D2. Normal EF  . Hand surgery     History   Social History  . Marital Status: Married    Spouse Name: N/A    Number of Children: N/A  . Years of Education: N/A   Occupational History  . Not on file.   Social History Main Topics  . Smoking status: Never Smoker   . Smokeless tobacco: Never Used  . Alcohol Use: No  . Drug Use: No  . Sexually Active: Not on file    Other Topics Concern  . Not on file   Social History Narrative  . No narrative on file    Family Status  Relation Status Death Age  . Mother Deceased   . Father Deceased     ROS:  A complete 10 system ROS was obtained and was unremarkable except as above.   VITALS:   Filed Vitals:   12/10/11 1318  BP: 122/68  Pulse: 72  Temp: 97.6 F (36.4 C)  Resp: 16  Weight: 179 lb (81.194 kg)   HEENT:  Normocephalic, atraumatic. The mucous membranes are moist. The superficial temporal arteries are without ropiness or tenderness. Cardiovascular: Regular rate and rhythm. Lungs: Clear to auscultation bilaterally. Neck: There are no carotid bruits noted bilaterally.  NEUROLOGICAL:  Orientation:  A complete MMSE was performed and the patient scored a 17/30.  He did not know the date, day of the week or month. Cranial nerves: There is good facial symmetry. The pupils are equal round and minimally reactive to light bilaterally. Funduscopic exam reveals clear disc margins bilaterally. Extraocular muscles are intact and visual fields are full to confrontational testing. Speech is fluent and clear. Soft palate rises symmetrically and there is no tongue deviation. Hearing is intact to conversational tone. Tone: Tone is good throughout. Sensation: Sensation is intact to light touch and pinprick throughout. Vibration is intact at the bilateral big toe. There is no extinction with double simultaneous stimulation. There is no sensory dermatomal level identified. Coordination:  The patient has no difficulty with RAM's or FNF bilaterally. Motor: Strength is 5/5 in the bilateral upper and lower extremities. There is no pronator drift.  There are no fasciculations noted. DTR's: Deep tendon reflexes are 2/4 at the  bilateral biceps, triceps, brachioradialis, patella and achilles.  Plantar responses are downgoing bilaterally. Gait and Station: The patient is able to ambulate without difficulty. The patient  is able to heel toe walk without any difficulty. The patient is able to ambulate in a tandem fashion with minimal. The patient is able to stand in the Romberg position.  Imp: 1.  Dementia, likely of the Alzheimers type.  I spent greater than 50% of this 60 minute visit in counseling with the patient and his wife.  We talked about the diagnosis.  We talked about safety and medical treatments. 2.  Obstructive sleep apnea syndrome, noncompliant with CPAP.  Plan:  1.  I talked to them about safety.  I am concerned about his driving.  We talked about the AAN guidelines. I would like to see him either undergo an occupational therapy safety driving evaluation or an independent medical driving evaluation.  Right now, they did not wish to schedule I there, but they are going to think about that.  We did talk about the risks associated with driving, which can be quite significant. 2.  We decided to initiate Razadyne.  We will start with low-dose, at Razadyne ER 8 mg daily.  Risks, benefits, side effects and alternative therapies were discussed.  The opportunity to ask questions was given and they were answered to the best of my ability.  The patient expressed understanding and willingness to follow the outlined treatment protocols. 3.  Return in about 4 months (around 04/10/2012).  They are to call with questions/concerns before then.

## 2011-12-10 NOTE — Patient Instructions (Addendum)
Call us back if you decide to do the driving evaluation at either [facility] or [facility] or with an independent Environmental consultant.

## 2012-03-14 ENCOUNTER — Telehealth: Payer: Self-pay | Admitting: Neurology

## 2012-03-14 NOTE — Telephone Encounter (Signed)
The patient's wife called to ask if Dr. Arbutus Leas would fill out FMLA paperwork for her due her husband's condition..  The patient's wife works for [employer] and her supervisor stated it would be a good idea to have it in place in the event she was late or needed to be out to care for her husband who has Alzheimer's dementia.  The patient has a follow up appointment on 04/08/12.  Please call the patient's wife at [phone].

## 2012-03-17 NOTE — Telephone Encounter (Signed)
Yes I will fill it out but need to know (b/c the form asks) how often she is out for caregiving.

## 2012-03-17 NOTE — Telephone Encounter (Signed)
She is estimating just once a month currently.  She actually has only been out with him once, but just to have something on file is going with once a month.  She will have papers sent over so she can pick them up at his 1/24 appt.

## 2012-04-08 ENCOUNTER — Encounter: Payer: Self-pay | Admitting: Neurology

## 2012-04-08 ENCOUNTER — Encounter (INDEPENDENT_AMBULATORY_CARE_PROVIDER_SITE_OTHER): Payer: 59

## 2012-04-08 ENCOUNTER — Ambulatory Visit (INDEPENDENT_AMBULATORY_CARE_PROVIDER_SITE_OTHER): Payer: 59 | Admitting: Neurology

## 2012-04-08 VITALS — BP 138/70 | HR 60 | Temp 97.7°F | Resp 16 | Wt 177.0 lb

## 2012-04-08 DIAGNOSIS — I6529 Occlusion and stenosis of unspecified carotid artery: Secondary | ICD-10-CM

## 2012-04-08 DIAGNOSIS — G309 Alzheimer's disease, unspecified: Secondary | ICD-10-CM

## 2012-04-08 MED ORDER — GALANTAMINE HYDROBROMIDE ER 16 MG PO CP24: 16.0000 mg | ORAL_CAPSULE | Freq: Every day | ORAL | Status: DC

## 2012-04-08 NOTE — Progress Notes (Signed)
The patient is seen in neurologic follow up regarding AD.  This patient is accompanied in the office by his spouse who supplements the history.   The patient is a 72 y.o. year cancel male who has had memory issues for about 7 years, since hisCABG in 2006.  The patient does not do the finances in the home.  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  The patient does drive. He is able to get to familiar places but he would not be able to get to unfamiliar places with directions.  The farthest he drives is 15 min from home.   There have not been any motor vehicle accidents in the recent years.  He has refused OT driving eval and it has therefore been our recommendation that he quit driving.  The patient does not cook.  They generally eat out.      The patient is  able to perform his own ADL's.  The patient is not able to distribute his own medications.  His wife has done his pill box preparation for at least 11 years, which is how long they have been married.  She thinks that he would not know to take medications without her.  The patients bladder and bowel are  under good control.  There have been some behavioral changes over the years.  The initatiation of Lexapro has helped the easy agitation. There have been no hallucinations.  He always recognizes his family.  He had no difficulty recognizing family over the holidays.  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  His wife was and is very upset about this.  The large majority of the day the patient is with his grandson, but his wife also states that his grandson would not monitor something like this sale.  Last visit, razadyne was started.  He is tolerating the medication well.  Previous medications:  Aricept (cramps in hands)                                       Exelon patch (personality change)   Namenda (d/c secondary to "no help")                                       Lexapro helps agitation  The patient has OSA but is noncompliant with CPAP.  He snores on his back.  He quit using it about 1 1/2 years ago.    No Known Allergies  Current Outpatient Prescriptions on File Prior to Visit  Medication Sig Dispense Refill  . aspirin 325 MG EC tablet Take 325 mg by mouth daily.        Marland Kitchen atorvastatin (LIPITOR) 40 MG tablet Take 1 tablet (40 mg total) by mouth daily.  90 tablet  3  . Clorazepate Dipotassium (TRANXENE-SD) 11.25 MG TB24 Take 11.25 mg by mouth as needed. Take 1/2 tablet as needed        . escitalopram (LEXAPRO) 20 MG tablet Take 1 tablet (20 mg total) by mouth daily.  90 tablet  1  . esomeprazole (NEXIUM) 40 MG capsule Take 1 capsule (40 mg total) by mouth daily before breakfast.  90 capsule  3  . galantamine (RAZADYNE ER) 8 MG 24 hr capsule Take 1 capsule (8 mg total) by mouth daily with breakfast.  90 capsule  1  . Multiple Vitamin (MULTIVITAMIN) capsule Take 1 capsule by mouth daily.        . nitroGLYCERIN (NITROSTAT) 0.4 MG SL tablet Place 0.4 mg under the tongue every 5 (five) minutes as needed.        . propranolol (INDERAL) 10 MG tablet Take one tablet by mouth at lunch and supper  60 tablet  6  . Tamsulosin HCl (FLOMAX) 0.4 MG CAPS Take 0.4 mg by mouth daily.        . [DISCONTINUED] tadalafil (CIALIS) 10 MG tablet Take 10 mg by mouth daily as needed.          Past Medical History  Diagnosis Date  . Coronary artery disease     prior bypass grafting. Most recent Myoview 2011 demonstrated no ischemia and normal left ventricular function and if you or a mean and a 1 and a for a to a he is a for a  . Sleep apnea     noncompliant with CPAP  . Memory loss   . Heart murmur   . Syncope and collapse     No arrhythmia on loop recorder. Likely orthostatic hypotension  . Carotid artery disease     on the right side -60-79%  . Hyperlipidemia     Past Surgical History    Procedure Date  . Coronary artery bypass graft 2006  . Cardiac catheterization 2010    patent LIMA to LAD, SVG to D1, SVG to OM3, occluded SVG to a small D2. Normal EF  . Hand surgery   . Appendectomy     History   Social History  . Marital Status: Married    Spouse Name: N/A    Number of Children: N/A  . Years of Education: N/A   Occupational History  . retired     Visual merchandiser   Social History Main Topics  . Smoking status: Never Smoker   . Smokeless tobacco: Never Used  . Alcohol Use: No  . Drug Use: No  . Sexually Active: Not on file   Other Topics Concern  . Not on file   Social History Narrative  . No narrative on file    Family Status  Relation Status Death Age  . Mother Deceased     ? MI  . Father Deceased     kidney failure, CAD  . Sister Alive     3, alive and well  . Brother Deceased     3, melanoma, suicide, pneumonia  . Brother Alive     5, alive and well  . Child Alive     alive and well    ROS:  A complete 10 system ROS was obtained and was unremarkable except as above.   VITALS:   Filed Vitals:   04/08/12 1518  BP: 138/70  Pulse: 60  Temp: 97.7 F (36.5 C)  Resp: 16  Weight: 177 lb (80.287 kg)   HEENT:  Normocephalic, atraumatic. The mucous membranes are moist. The superficial temporal arteries are without ropiness or tenderness. Cardiovascular: Regular rate and rhythm. Lungs: Clear to auscultation bilaterally. Neck: There are no carotid bruits noted bilaterally.  NEUROLOGICAL:  Orientation:  A complete MMSE was performed last visit. and the patient scored a 17/30.  It takes him quite some time, but he states it is Jan or Feb 2014.  He doesn't know the date or day of week. Cranial nerves: There is good facial symmetry. The pupils are equal round and minimally reactive to light bilaterally. Funduscopic exam reveals clear disc margins bilaterally. Extraocular muscles are intact and visual fields are full to confrontational testing. Speech  is fluent and clear. Soft palate rises symmetrically and there is no tongue deviation. Hearing is intact to conversational tone. Tone: Tone is good throughout. Sensation: Sensation is intact to light touch throughout Coordination:  The patient has no difficulty with RAM's or FNF bilaterally. Motor: Strength is 5/5 in the bilateral upper and lower extremities. There is no pronator drift.  There are no fasciculations noted.   Imp: 1.  Dementia, likely of the Alzheimers type.  I spent greater than 50% of this 60 minute visit in counseling with the patient and his wife.  We talked about the diagnosis.  We talked about safety and medical treatments.  -An OT driving eval is recommended if the patient wants to continue driving.  I recommended no driving.  -Razadyne will be increased to 16 mg daily.  Risks, benefits, side effects and alternative therapies were discussed.  The opportunity to ask questions was given and they were answered to the best of my ability.  The patient expressed understanding and willingness to follow the outlined treatment protocols.  -I recommended daily physical and mental exercises/learning new skills.  -I recommended complete monitoring over finances.  I recommend someone is around all the time with him.  Greater than 50% of visit in counseling (visit was 40 min) 2.  Obstructive sleep apnea syndrome, noncompliant with CPAP.

## 2012-04-08 NOTE — Patient Instructions (Addendum)
1.  Increase razadyne to 16 mg daily 2.  EXERCISE safely 3.  Finances and daily activities should be monitored 4.  It is my recommendation that you do not drive 5.  Learn a new hobby 6.  Call me with problems

## 2012-04-14 ENCOUNTER — Telehealth: Payer: Self-pay | Admitting: Neurology

## 2012-04-14 NOTE — Telephone Encounter (Signed)
Pt's wife aware, she will call his pcp to get him seen and go from there.

## 2012-04-14 NOTE — Telephone Encounter (Signed)
Picked up a call from [place], the patient's wife. She called to see if the increase dose of Razadyne could be causing facial swelling. She reports that they saw Dr. Arbutus Leas on 01/24 and she increased the Razadyne from 8 to 16 mg. They started that dose last Sat. Yesterday he started having swelling under his eyes to the point that it looked as if they could take a pin and "pop" it. Denies pain or vision problems. Today, it's more just orbital swelling. She says he did not have any symptoms on the 8 mg dosing. She states nothing else has changed that she is aware of to cause this. I told her that it would seem likely that this was being caused by the higher dose of medication and that perhaps backing down to the lower dose to see if the swelling goes away would be a reasonable thing to try. I told her that I would get Dr. Don Perking recommendation and give her a call back. She is in favor of this plan. She can be reached at her cell number [phone]) or work number [phone]). **Dr. Arbutus Leas, please advise.

## 2012-04-14 NOTE — Telephone Encounter (Signed)
I would doubt very much this was from the razadyne.  However, it is very concerning and I would like him to be checked out by PCP before doing anything more with razadyne.

## 2012-05-04 ENCOUNTER — Ambulatory Visit: Payer: 59 | Admitting: Cardiovascular Disease

## 2012-05-11 ENCOUNTER — Ambulatory Visit (INDEPENDENT_AMBULATORY_CARE_PROVIDER_SITE_OTHER): Payer: 59 | Admitting: Cardiovascular Disease

## 2012-05-11 ENCOUNTER — Encounter: Payer: Self-pay | Admitting: Cardiovascular Disease

## 2012-05-11 ENCOUNTER — Telehealth: Payer: Self-pay

## 2012-05-11 VITALS — BP 112/58 | HR 60 | Ht 68.0 in

## 2012-05-11 DIAGNOSIS — E785 Hyperlipidemia, unspecified: Secondary | ICD-10-CM

## 2012-05-11 DIAGNOSIS — I25810 Atherosclerosis of coronary artery bypass graft(s) without angina pectoris: Secondary | ICD-10-CM

## 2012-05-11 DIAGNOSIS — I779 Disorder of arteries and arterioles, unspecified: Secondary | ICD-10-CM

## 2012-05-11 DIAGNOSIS — R002 Palpitations: Secondary | ICD-10-CM

## 2012-05-11 MED ORDER — ESOMEPRAZOLE MAGNESIUM 40 MG PO CPDR: 40.0000 mg | DELAYED_RELEASE_CAPSULE | Freq: Every day | ORAL | Status: DC

## 2012-05-11 NOTE — Telephone Encounter (Signed)
Pt's wife asking for 90 day supply of galantamine.

## 2012-05-11 NOTE — Progress Notes (Signed)
HPI  This is a 72 year old man who is here today for followup visit. He has known history of coronary artery disease status post coronary artery bypass graft surgery in 2006. He also has moderate asymptomatic right carotid stenosis as well as recurrent syncope without documented arrhythmia on loop recorder. He was seen last year by Dr. Graciela Husbands for postprandial palpitations and was noted to have frequent PVCs and ventricular bigeminy. He was started on propranolol with significant improvement. He reports no further palpitations at this time.  The patient has been doing very well overall without reported chest pain or dyspnea. He has not had any syncope or presyncope. He continues to suffer from memory problems. He had recent labs which were within normal limits.  No Known Allergies   Current Outpatient Prescriptions on File Prior to Visit  Medication Sig Dispense Refill  . aspirin 325 MG EC tablet Take 325 mg by mouth daily.        Marland Kitchen atorvastatin (LIPITOR) 40 MG tablet Take 1 tablet (40 mg total) by mouth daily.  90 tablet  3  . Clorazepate Dipotassium (TRANXENE-SD) 11.25 MG TB24 Take 11.25 mg by mouth as needed. Take 1/2 tablet as needed        . escitalopram (LEXAPRO) 20 MG tablet Take 1 tablet (20 mg total) by mouth daily.  90 tablet  1  . galantamine (RAZADYNE ER) 16 MG 24 hr capsule Take 1 capsule (16 mg total) by mouth daily with breakfast.  30 capsule  5  . Multiple Vitamin (MULTIVITAMIN) capsule Take 1 capsule by mouth daily.        . nitroGLYCERIN (NITROSTAT) 0.4 MG SL tablet Place 0.4 mg under the tongue every 5 (five) minutes as needed.        . propranolol (INDERAL) 10 MG tablet Take one tablet by mouth at lunch and supper  60 tablet  6  . Tamsulosin HCl (FLOMAX) 0.4 MG CAPS Take 0.4 mg by mouth daily.        . [DISCONTINUED] tadalafil (CIALIS) 10 MG tablet Take 10 mg by mouth daily as needed.         No current facility-administered medications on file prior to visit.      Past Medical History  Diagnosis Date  . Sleep apnea     noncompliant with CPAP  . Memory loss   . Heart murmur   . Syncope and collapse     No arrhythmia on loop recorder. Likely orthostatic hypotension  . Carotid artery disease     on the right side -60-79%  . Hyperlipidemia   . Coronary artery disease     prior bypass grafting. Most recent Myoview 2011 demonstrated no ischemia and normal left ventricular function     Past Surgical History  Procedure Laterality Date  . Coronary artery bypass graft  2006  . Cardiac catheterization  2010    patent LIMA to LAD, SVG to D1, SVG to OM3, occluded SVG to a small D2. Normal EF  . Hand surgery    . Appendectomy       No family history on file.   History   Social History  . Marital Status: Married    Spouse Name: N/A    Number of Children: N/A  . Years of Education: N/A   Occupational History  . retired     Visual merchandiser   Social History Main Topics  . Smoking status: Never Smoker   . Smokeless tobacco: Never Used  .  Alcohol Use: No  . Drug Use: No  . Sexually Active: Not on file   Other Topics Concern  . Not on file   Social History Narrative  . No narrative on file      PHYSICAL EXAM   BP 112/58  Pulse 60  Ht 5\' 8"  (1.727 m)  SpO2 98%  Constitutional: He is oriented to person, place, and time. He appears well-developed and well-nourished. No distress.  HENT: No nasal discharge.  Head: Normocephalic and atraumatic.  Eyes: Pupils are equal, round, and reactive to light. Right eye exhibits no discharge. Left eye exhibits no discharge.  Neck: Normal range of motion. Neck supple. No JVD present. No thyromegaly present. There is right carotid bruit. Cardiovascular: Normal rate, regular rhythm, normal heart sounds and intact distal pulses. Exam reveals no gallop and no friction rub.  Visit 2/6 systolic ejection murmur in the aortic area Pulmonary/Chest: Effort normal and breath sounds normal. No stridor. No  respiratory distress. He has no wheezes. He has no rales. He exhibits no tenderness.  Abdominal: Soft. Bowel sounds are normal. He exhibits no distension. There is no tenderness. There is no rebound and no guarding.  Musculoskeletal: Normal range of motion. He exhibits no edema and no tenderness.  Neurological: He is alert and oriented to person, place, and time. Coordination normal.  Skin: Skin is warm and dry. No rash noted. He is not diaphoretic. No erythema. No pallor.  Psychiatric: He has a normal mood and affect. His behavior is normal. Judgment and thought content normal.     EKG: Normal sinus rhythm with T wave changes in the inferior and anterolateral leads suggestive of ischemia. These changes are not new compared to his old EKG.   ASSESSMENT AND PLAN

## 2012-05-11 NOTE — Assessment & Plan Note (Signed)
He is stable from a cardiac standpoint with no symptoms suggestive of angina or heart failure. Continue medical therapy.

## 2012-05-11 NOTE — Assessment & Plan Note (Signed)
Due to document the PVCs and ventricular bigeminy. He responded very well to treatment with propranolol.

## 2012-05-11 NOTE — Assessment & Plan Note (Signed)
Continue treatment with atorvastatin. Recent LFTs were normal. Lipid profile showed a total cholesterol of 121, triglyceride of 52, HDL 53 and an LDL of 58.

## 2012-05-11 NOTE — Patient Instructions (Addendum)
Your physician recommends that you continue on your current medications as directed. Please refer to the Current Medication list given to you today.  Your physician wants you to follow-up in: 1 year. You will receive a reminder letter in the mail two months in advance. If you don't receive a letter, please call our office to schedule the follow-up appointment.  

## 2012-05-11 NOTE — Assessment & Plan Note (Signed)
He has stable 60-79% stenosis involving the right internal carotid artery. This was checked in January of this year.

## 2012-05-11 NOTE — Telephone Encounter (Signed)
That is fine with me for 90 days supply.  Please put a refill on it as well.  Thanks!

## 2012-05-12 MED ORDER — GALANTAMINE HYDROBROMIDE ER 16 MG PO CP24: 16.0000 mg | ORAL_CAPSULE | Freq: Every day | ORAL | Status: DC

## 2012-05-12 NOTE — Telephone Encounter (Signed)
Done

## 2012-09-06 ENCOUNTER — Encounter: Payer: Self-pay | Admitting: Internal Medicine

## 2012-09-07 ENCOUNTER — Telehealth: Payer: Self-pay | Admitting: Neurology

## 2012-09-07 NOTE — Telephone Encounter (Signed)
I think that I got this twice.  Anyway, I would doubt but could be.  Can hold if wants.  Risk of holding is worsening memory.  Probably needs f/u.  Hasn't been seen since Jan

## 2012-09-07 NOTE — Telephone Encounter (Signed)
10LB WEIGHT LOSS - IS THIS THE A SIDE EFFECT OF THE GALANTAMINE (RAZADYNE)  -16 MG A DAY. HE IS TAKING. WIFE VERY CONCERNED.

## 2012-09-07 NOTE — Telephone Encounter (Signed)
Pt's wife calling because she is concerned with pt's weight loss he is currently at 168lbs.  He gets really nauseated about once every 2 weeks, but doesn't vomit.  Really fatigued feeling.  He saw his pcp yesterday who did labs, she doesn't have results yet.  If they are normal then she is going to order a CT abdomen.  Mrs. Detweiler wasn't sure if the weight loss is from the razadyne or not.  She will fax the labs once she gets them.

## 2012-09-08 NOTE — Telephone Encounter (Signed)
Pt's wife aware, they are scheduled for a f/u already.

## 2012-09-12 ENCOUNTER — Other Ambulatory Visit: Payer: Self-pay | Admitting: *Deleted

## 2012-09-12 DIAGNOSIS — R634 Abnormal weight loss: Secondary | ICD-10-CM

## 2012-09-12 DIAGNOSIS — R110 Nausea: Secondary | ICD-10-CM

## 2012-09-12 DIAGNOSIS — R5383 Other fatigue: Secondary | ICD-10-CM

## 2012-09-13 ENCOUNTER — Ambulatory Visit: Payer: 59 | Admitting: Neurology

## 2012-09-15 ENCOUNTER — Ambulatory Visit (INDEPENDENT_AMBULATORY_CARE_PROVIDER_SITE_OTHER)
Admission: RE | Admit: 2012-09-15 | Discharge: 2012-09-15 | Disposition: A | Discharge status: Home / Self Care | Payer: 59 | Source: Ambulatory Visit | Attending: Internal Medicine | Admitting: Internal Medicine

## 2012-09-15 DIAGNOSIS — R634 Abnormal weight loss: Secondary | ICD-10-CM

## 2012-09-15 DIAGNOSIS — R5381 Other malaise: Secondary | ICD-10-CM

## 2012-09-15 DIAGNOSIS — R5383 Other fatigue: Secondary | ICD-10-CM

## 2012-09-15 DIAGNOSIS — R110 Nausea: Secondary | ICD-10-CM

## 2012-09-15 MED ORDER — IOHEXOL 300 MG/ML  SOLN
100.0000 mL | Freq: Once | INTRAMUSCULAR | Status: AC | PRN
  Administered 2012-09-15: 100 mL via INTRAVENOUS

## 2012-10-17 ENCOUNTER — Other Ambulatory Visit: Payer: Self-pay | Admitting: *Deleted

## 2012-10-17 MED ORDER — NITROGLYCERIN 0.4 MG SL SUBL: 0.4000 mg | SUBLINGUAL_TABLET | SUBLINGUAL | Status: DC | PRN

## 2012-10-21 ENCOUNTER — Encounter: Payer: Self-pay | Admitting: Neurology

## 2012-10-21 ENCOUNTER — Ambulatory Visit (INDEPENDENT_AMBULATORY_CARE_PROVIDER_SITE_OTHER): Payer: 59 | Admitting: Neurology

## 2012-10-21 ENCOUNTER — Encounter (INDEPENDENT_AMBULATORY_CARE_PROVIDER_SITE_OTHER): Payer: 59

## 2012-10-21 VITALS — BP 138/74 | HR 64 | Temp 97.6°F | Resp 16 | Wt 171.0 lb

## 2012-10-21 DIAGNOSIS — R634 Abnormal weight loss: Secondary | ICD-10-CM

## 2012-10-21 DIAGNOSIS — F0280 Dementia in other diseases classified elsewhere without behavioral disturbance: Secondary | ICD-10-CM

## 2012-10-21 DIAGNOSIS — E538 Deficiency of other specified B group vitamins: Secondary | ICD-10-CM

## 2012-10-21 DIAGNOSIS — I6529 Occlusion and stenosis of unspecified carotid artery: Secondary | ICD-10-CM

## 2012-10-21 NOTE — Progress Notes (Signed)
The patient is seen in neurologic follow up regarding AD.  This patient is accompanied in the office by his spouse who supplements the history.   The patient is a 72 y.o. year cancel male who has had memory issues for about 7 years, since hisCABG in 2006.  The patient does not do the finances in the home.  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  The patient does drive. He is able to get to familiar places but he would not be able to get to unfamiliar places with directions.  The farthest he drives is 15 min from home.   There have not been any motor vehicle accidents in the recent years.  He has refused OT driving eval and it has therefore been our recommendation that he quit driving.  The patient does not cook.  They generally eat out.      The patient is  able to perform his own ADL's.  The patient is not able to distribute his own medications.  His wife has done his pill box preparation for at least 11 years, which is how long they have been married.  She thinks that he would not know to take medications without her.  The patients bladder and bowel are  under good control.  There have been some behavioral changes over the years.  The initatiation of Lexapro has helped the easy agitation. There have been no hallucinations.  He always recognizes his family.  He had no difficulty recognizing family over the holidays.  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  His wife was and is very upset about this.  The large majority of the day the patient is with his grandson, but his wife also states that his grandson would not monitor something like this sale.  10/21/12 update:  The patient has Alzheimer's dementia and is currently on Razadyne, 16 mg daily.  His wife did call me and asked if this was the cause of his weight loss.  I told her that, although it could be, I doubted it somewhat  but told her that she could go ahead and hold it.  He has been off of it since the end of June.  He also saw a gastroenterologist.  A CT of the abdomen and pelvis was performed.  There was rectosigmoid junction and wall thickening, but it was felt that it was due to bowel distention.  Nonetheless, the patient is to undergo a colonoscopy.  He reported that he also had a B12 level done by his gastroenterologist which was low.  I do not have a copy of that.  He has been drinking boost and his wife thinks that perhaps this is why his weight is now going up.  However, they both admit that he really does not eat well.  He does very little protein, choosing to eat Raisin Bran much of the time.  He is still driving, although minimally.  Previous medications:  Aricept (cramps in hands)                                       Exelon patch (personality change)  Namenda (d/c secondary to "no help")                                       Lexapro helps agitation  The patient has OSA but is noncompliant with CPAP.  He snores on his back.  He quit using it about 1 1/2 years ago.    No Known Allergies  Current Outpatient Prescriptions on File Prior to Visit  Medication Sig Dispense Refill  . aspirin 325 MG EC tablet Take 325 mg by mouth daily.        Marland Kitchen atorvastatin (LIPITOR) 40 MG tablet Take 1 tablet (40 mg total) by mouth daily.  90 tablet  3  . Clorazepate Dipotassium (TRANXENE-SD) 11.25 MG TB24 Take 11.25 mg by mouth as needed. Take 1/2 tablet as needed        . escitalopram (LEXAPRO) 20 MG tablet Take 1 tablet (20 mg total) by mouth daily.  90 tablet  1  . esomeprazole (NEXIUM) 40 MG capsule Take 1 capsule (40 mg total) by mouth daily before breakfast.  90 capsule  3  . galantamine (RAZADYNE ER) 16 MG 24 hr capsule Take 1 capsule (16 mg total) by mouth daily with breakfast.  90 capsule  1  . Multiple Vitamin (MULTIVITAMIN) capsule Take 1 capsule by mouth daily.        .  nitroGLYCERIN (NITROSTAT) 0.4 MG SL tablet Place 1 tablet (0.4 mg total) under the tongue every 5 (five) minutes as needed.  25 tablet  8  . propranolol (INDERAL) 10 MG tablet Take one tablet by mouth at lunch and supper  60 tablet  6  . Tamsulosin HCl (FLOMAX) 0.4 MG CAPS Take 0.4 mg by mouth daily.        . [DISCONTINUED] tadalafil (CIALIS) 10 MG tablet Take 10 mg by mouth daily as needed.         No current facility-administered medications on file prior to visit.    Past Medical History  Diagnosis Date  . Sleep apnea     noncompliant with CPAP  . Memory loss   . Heart murmur   . Syncope and collapse     No arrhythmia on loop recorder. Likely orthostatic hypotension  . Carotid artery disease     on the right side -60-79%  . Hyperlipidemia   . Coronary artery disease     prior bypass grafting. Most recent Myoview 2011 demonstrated no ischemia and normal left ventricular function    Past Surgical History  Procedure Laterality Date  . Coronary artery bypass graft  2006  . Cardiac catheterization  2010    patent LIMA to LAD, SVG to D1, SVG to OM3, occluded SVG to a small D2. Normal EF  . Hand surgery    . Appendectomy      History   Social History  . Marital Status: Married    Spouse Name: N/A    Number of Children: N/A  . Years of Education: N/A   Occupational History  . retired     Visual merchandiser   Social History Main Topics  . Smoking status: Never Smoker   . Smokeless tobacco: Never Used  . Alcohol Use: No  . Drug Use: No  . Sexually Active: Not on file   Other Topics Concern  . Not on file   Social History Narrative  . No narrative on file    Family  Status  Relation Status Death Age  . Mother Deceased     ? MI  . Father Deceased     kidney failure, CAD  . Sister Alive     3, alive and well  . Brother Deceased     3, melanoma, suicide, pneumonia  . Brother Alive     5, alive and well  . Child Alive     alive and well    ROS:  A complete 10 system  ROS was obtained and was unremarkable except as above.   VITALS:   There were no vitals filed for this visit. HEENT:  Normocephalic, atraumatic. The mucous membranes are moist. The superficial temporal arteries are without ropiness or tenderness. Cardiovascular: Regular rate and rhythm. Lungs: Clear to auscultation bilaterally. Neck: There are no carotid bruits noted bilaterally.  NEUROLOGICAL:  Orientation:  A complete MMSE was performed today and he scored a 19/30.  Previously, he scored 17/30 Cranial nerves: There is good facial symmetry. The pupils are equal round and minimally reactive to light bilaterally. Funduscopic exam reveals clear disc margins bilaterally. Extraocular muscles are intact and visual fields are full to confrontational testing. Speech is fluent and clear. Soft palate rises symmetrically and there is no tongue deviation. Hearing is intact to conversational tone. Tone: Tone is good throughout. Sensation: Sensation is intact to light touch throughout Coordination:  The patient has no difficulty with RAM's or FNF bilaterally. Motor: Strength is 5/5 in the bilateral upper and lower extremities. There is no pronator drift.  There are no fasciculations noted.   Imp/plan: 1.  Dementia, likely of the Alzheimers type.  I spent greater than 50% of this 60 minute visit in counseling with the patient and his wife.  We talked about the diagnosis.  We talked about safety and medical treatments.  -An OT driving eval is recommended if the patient wants to continue driving.  I recommended no driving.  -While I am not sure that the Razadyne 16 mg really cause any degree weight loss or nausea, as he did not have it for the first 6 months that he was on it, I did talk to them about perhaps just going back to the 8 mg dosage.  They're going to talk with a gastroenterologist, as he felt that he needed to stay off of all medication.  I did write his gastroenterologist, Dr. Chales Abrahams in [facility] a  letter.   Risks, benefits, side effects and alternative therapies were discussed.  The opportunity to ask questions was given and they were answered to the best of my ability.  The patient expressed understanding and willingness to follow the outlined treatment protocols.  -I recommended daily physical and mental exercises/learning new skills.  -I recommended complete monitoring over finances.  I recommend someone is around all the time with him.   2.  Obstructive sleep apnea syndrome, noncompliant with CPAP.  -They understand morbidity and mortality associated with untreated sleep apnea. 3.  Possible B12 deficiency.  -I. asked his wife to give me a copy of the B12 level that was drawn at the gastroenterology office in [place] 4.  F/u in 6 months.

## 2012-10-21 NOTE — Patient Instructions (Addendum)
Follow-up in six months.

## 2012-10-24 ENCOUNTER — Telehealth: Payer: Self-pay

## 2012-10-24 ENCOUNTER — Telehealth: Payer: Self-pay | Admitting: Neurology

## 2012-10-24 ENCOUNTER — Telehealth: Payer: Self-pay | Admitting: Nurse Practitioner

## 2012-10-24 NOTE — Telephone Encounter (Signed)
rec'd records from [facility], Forward 6 Pg. To Dr.Tat

## 2012-10-24 NOTE — Telephone Encounter (Signed)
Reviewed results of carotid duplex with patient and wife and placed order for repeat in 6 months, just prior to patient's f/u with Dr. Kirke Corin so that results can be reviewed at patient's ov.

## 2012-10-25 ENCOUNTER — Other Ambulatory Visit: Payer: Self-pay | Admitting: *Deleted

## 2012-10-25 MED ORDER — ESCITALOPRAM OXALATE 20 MG PO TABS: 20.0000 mg | ORAL_TABLET | Freq: Every day | ORAL | Status: AC

## 2012-10-25 MED ORDER — ATORVASTATIN CALCIUM 40 MG PO TABS: 40.0000 mg | ORAL_TABLET | Freq: Every day | ORAL | Status: DC

## 2012-10-25 NOTE — Telephone Encounter (Signed)
Pt's wife calling for refill of Lexapro.  Sent in to [facility] out pt pharmacy.

## 2012-10-28 ENCOUNTER — Telehealth: Payer: Self-pay

## 2012-10-28 MED ORDER — GALANTAMINE HYDROBROMIDE ER 8 MG PO CP24: 8.0000 mg | ORAL_CAPSULE | Freq: Every day | ORAL | Status: DC

## 2012-10-28 NOTE — Telephone Encounter (Signed)
Pt's wife notified.

## 2012-10-28 NOTE — Telephone Encounter (Signed)
You can fax RX for the Razadyne 8 mg daily.  I would suggest oral B12 daily and I will recheck it in future.

## 2012-10-28 NOTE — Telephone Encounter (Signed)
Pt's wife calling, pt had colonoscopy yesterday, fine but for a few diverticula.  He can start the 8mg  med now.  She didn't say name.  Also, wondering what you thought of the b12 level on labs that were faxed.

## 2012-12-05 ENCOUNTER — Other Ambulatory Visit: Payer: Self-pay

## 2012-12-05 DIAGNOSIS — I493 Ventricular premature depolarization: Secondary | ICD-10-CM

## 2012-12-05 MED ORDER — PROPRANOLOL HCL 10 MG PO TABS: ORAL_TABLET | ORAL | Status: DC

## 2013-01-26 ENCOUNTER — Telehealth: Payer: Self-pay | Admitting: Neurology

## 2013-01-26 NOTE — Telephone Encounter (Signed)
I called pt's wife back to get more details on what she needs to discuss with Dr.Tat and she explained the the pt is being sued by his son and so they need medical records and a letter from Dr.Tat proving that he has dementia and is incapable of making financial/business decisions or able to handle the stress of a trial. She wants Dr.Tat to call her.

## 2013-01-26 NOTE — Telephone Encounter (Signed)
They are more than welcome to the medical records with release.  I can write a letter stating that he is treated for dementia.    There are competancy evaluations that can be run if they wish.  I believe that psychiatry does these but I'm not sure.  Usually its done by someone appointed by the courts.

## 2013-01-27 ENCOUNTER — Encounter: Payer: Self-pay | Admitting: Neurology

## 2013-01-27 ENCOUNTER — Telehealth: Payer: Self-pay

## 2013-01-27 NOTE — Telephone Encounter (Signed)
I called pt's wife and relayed your message, she wants to have medical records and the letter sent to their attorney. She is faxing a release of medical records to [place] so we can do that.

## 2013-01-27 NOTE — Telephone Encounter (Signed)
The letter has been printed (on printer for you).  I believe the medical records request goes through some other dept but you can ask Sherri.

## 2013-01-31 NOTE — Telephone Encounter (Signed)
Faxed letter and medical records to attorney's office after receiving a signed release from pt.

## 2013-03-07 ENCOUNTER — Telehealth: Payer: Self-pay | Admitting: Cardiovascular Disease

## 2013-03-07 NOTE — Telephone Encounter (Signed)
Records rec Via Mail from [facility] gave to [facility] 03/07/13/KM

## 2013-04-21 ENCOUNTER — Ambulatory Visit: Payer: 59 | Admitting: Neurology

## 2013-04-28 ENCOUNTER — Ambulatory Visit: Payer: 59 | Admitting: Neurology

## 2013-05-16 ENCOUNTER — Ambulatory Visit (INDEPENDENT_AMBULATORY_CARE_PROVIDER_SITE_OTHER): Payer: 59 | Admitting: Cardiovascular Disease

## 2013-05-16 ENCOUNTER — Encounter: Payer: Self-pay | Admitting: Cardiovascular Disease

## 2013-05-16 ENCOUNTER — Ambulatory Visit (HOSPITAL_COMMUNITY): Payer: 59 | Attending: Cardiology

## 2013-05-16 VITALS — BP 136/76 | HR 54 | Ht 68.0 in | Wt 175.0 lb

## 2013-05-16 DIAGNOSIS — I779 Disorder of arteries and arterioles, unspecified: Secondary | ICD-10-CM

## 2013-05-16 DIAGNOSIS — N400 Enlarged prostate without lower urinary tract symptoms: Secondary | ICD-10-CM

## 2013-05-16 DIAGNOSIS — I739 Peripheral vascular disease, unspecified: Secondary | ICD-10-CM

## 2013-05-16 DIAGNOSIS — I6529 Occlusion and stenosis of unspecified carotid artery: Secondary | ICD-10-CM | POA: Insufficient documentation

## 2013-05-16 DIAGNOSIS — I25810 Atherosclerosis of coronary artery bypass graft(s) without angina pectoris: Secondary | ICD-10-CM | POA: Diagnosis not present

## 2013-05-16 DIAGNOSIS — E785 Hyperlipidemia, unspecified: Secondary | ICD-10-CM | POA: Diagnosis not present

## 2013-05-16 LAB — LIPID PANEL
CHOL/HDL RATIO: 2
Cholesterol: 129 mg/dL (ref 0–200)
HDL: 54.6 mg/dL (ref >39.00)
LDL CALC: 67 mg/dL (ref 0–99)
Triglycerides: 37 mg/dL (ref 0.0–149.0)
VLDL: 7.4 mg/dL (ref 0.0–40.0)

## 2013-05-16 LAB — HEPATIC FUNCTION PANEL
ALT: 17 U/L (ref 0–53)
AST: 19 U/L (ref 0–37)
Albumin: 3.8 g/dL (ref 3.5–5.2)
Alkaline Phosphatase: 53 U/L (ref 39–117)
BILIRUBIN TOTAL: 1 mg/dL (ref 0.3–1.2)
Bilirubin, Direct: 0.2 mg/dL (ref 0.0–0.3)
Total Protein: 6.3 g/dL (ref 6.0–8.3)

## 2013-05-16 LAB — PSA: PSA: 0.39 ng/mL (ref 0.10–4.00)

## 2013-05-16 MED ORDER — ESOMEPRAZOLE MAGNESIUM 40 MG PO CPDR: 40.0000 mg | DELAYED_RELEASE_CAPSULE | Freq: Every day | ORAL | Status: AC

## 2013-05-16 NOTE — Assessment & Plan Note (Signed)
He is doing very well with no symptoms suggestive of angina. Continue medical therapy. He has an abnormal EKG but is unchanged from most recent one.   

## 2013-05-16 NOTE — Progress Notes (Signed)
HPI  This is a 73 year old man who is here today for followup visit. He has known history of coronary artery disease status post coronary artery bypass graft surgery in 2006. He also has moderate asymptomatic right carotid stenosis as well as recurrent syncope without documented arrhythmia on loop recorder. He was seen last year by Dr. Caryl Comes for postprandial palpitations and was noted to have frequent PVCs and ventricular bigeminy. He was started on propranolol with significant improvement.   The patient has been doing very well overall without reported chest pain or dyspnea. He has not had any syncope or presyncope. He continues to suffer from memory problems due to moderate to severe dementia. He had recent labs which were within normal limits.  No Known Allergies   Current Outpatient Prescriptions on File Prior to Visit  Medication Sig Dispense Refill  . aspirin 325 MG EC tablet Take 325 mg by mouth daily.        Marland Kitchen atorvastatin (LIPITOR) 40 MG tablet Take 1 tablet (40 mg total) by mouth daily.  90 tablet  3  . Clorazepate Dipotassium (TRANXENE-SD) 11.25 MG TB24 Take 11.25 mg by mouth as needed. Take 1/2 tablet as needed        . escitalopram (LEXAPRO) 20 MG tablet Take 1 tablet (20 mg total) by mouth daily.  90 tablet  3  . esomeprazole (NEXIUM) 40 MG capsule Take 1 capsule (40 mg total) by mouth daily before breakfast.  90 capsule  3  . galantamine (RAZADYNE ER) 8 MG 24 hr capsule Take 1 capsule (8 mg total) by mouth daily with breakfast.  180 capsule  1  . Multiple Vitamin (MULTIVITAMIN) capsule Take 1 capsule by mouth daily.        . nitroGLYCERIN (NITROSTAT) 0.4 MG SL tablet Place 1 tablet (0.4 mg total) under the tongue every 5 (five) minutes as needed.  25 tablet  8  . Tamsulosin HCl (FLOMAX) 0.4 MG CAPS Take 0.4 mg by mouth daily.        . [DISCONTINUED] tadalafil (CIALIS) 10 MG tablet Take 10 mg by mouth daily as needed.         No current facility-administered medications on  file prior to visit.     Past Medical History  Diagnosis Date  . Sleep apnea     noncompliant with CPAP  . Memory loss   . Heart murmur   . Syncope and collapse     No arrhythmia on loop recorder. Likely orthostatic hypotension  . Carotid artery disease     on the right side -60-79%  . Hyperlipidemia   . Coronary artery disease     prior bypass grafting. Most recent Myoview 2011 demonstrated no ischemia and normal left ventricular function     Past Surgical History  Procedure Laterality Date  . Coronary artery bypass graft  2006  . Cardiac catheterization  2010    patent LIMA to LAD, SVG to D1, SVG to OM3, occluded SVG to a small D2. Normal EF  . Hand surgery    . Appendectomy       No family history on file.   History   Social History  . Marital Status: Married    Spouse Name: N/A    Number of Children: N/A  . Years of Education: N/A   Occupational History  . retired     [place] History Main Topics  . Smoking status: Never Smoker   . Smokeless tobacco: Never  Used  . Alcohol Use: No  . Drug Use: No  . Sexual Activity: Not on file   Other Topics Concern  . Not on file   Social History Narrative  . No narrative on file      PHYSICAL EXAM   BP 136/76  Pulse 54  Ht 5\' 8"  (1.727 m)  Wt 175 lb (79.379 kg)  BMI 26.61 kg/m2  Constitutional: He is oriented to person, place, and time. He appears well-developed and well-nourished. No distress.  HENT: No nasal discharge.  Head: Normocephalic and atraumatic.  Eyes: Pupils are equal, round, and reactive to light. Right eye exhibits no discharge. Left eye exhibits no discharge.  Neck: Normal range of motion. Neck supple. No JVD present. No thyromegaly present. There is right carotid bruit. Cardiovascular: Normal rate, regular rhythm, normal heart sounds and intact distal pulses. Exam reveals no gallop and no friction rub.  Visit 2/6 systolic ejection murmur in the aortic area Pulmonary/Chest:  Effort normal and breath sounds normal. No stridor. No respiratory distress. He has no wheezes. He has no rales. He exhibits no tenderness.  Abdominal: Soft. Bowel sounds are normal. He exhibits no distension. There is no tenderness. There is no rebound and no guarding.  Musculoskeletal: Normal range of motion. He exhibits no edema and no tenderness.  Neurological: He is alert and oriented to person, place, and time. Coordination normal.  Skin: Skin is warm and dry. No rash noted. He is not diaphoretic. No erythema. No pallor.  Psychiatric: He has a normal mood and affect. His behavior is normal. Judgment and thought content normal.     EKG: Normal sinus rhythm with T wave changes in the inferior and anterolateral leads suggestive of ischemia. These changes are not new compared to his old EKG.   ASSESSMENT AND PLAN

## 2013-05-16 NOTE — Assessment & Plan Note (Signed)
He has moderate 60-79% stenosis involving the right internal carotid artery. He is getting a repeat carotid Doppler today. He is asymptomatic.

## 2013-05-16 NOTE — Assessment & Plan Note (Signed)
Continue treatment with atorvastatin. Check fasting lipid and liver profile today. 

## 2013-05-16 NOTE — Patient Instructions (Signed)
Your physician recommends that you return for a FASTING lipid profile: hepatic and PSA today.  Your physician wants you to follow-up in: 1 year with Arida. You will receive a reminder letter in the mail two months in advance. If you don't receive a letter, please call our office to schedule the follow-up appointment.

## 2013-05-18 ENCOUNTER — Telehealth: Payer: Self-pay | Admitting: *Deleted

## 2013-05-18 DIAGNOSIS — I779 Disorder of arteries and arterioles, unspecified: Secondary | ICD-10-CM

## 2013-05-18 DIAGNOSIS — I739 Peripheral vascular disease, unspecified: Principal | ICD-10-CM

## 2013-05-18 NOTE — Telephone Encounter (Signed)
Message copied by Tracie Harrier on Thu May 18, 2013  8:18 AM ------      Message from: Kathlyn Sacramento A      Created: Wed May 17, 2013 10:09 AM       Stable right carotid stenosis with some worsening in left carotid stenosis but still in the moderate range.       Recommend repeat carotid doppler in 1 year. ------

## 2013-05-19 ENCOUNTER — Ambulatory Visit: Payer: 59 | Admitting: Neurology

## 2013-05-23 ENCOUNTER — Ambulatory Visit: Payer: 59 | Admitting: Neurology

## 2013-05-30 ENCOUNTER — Encounter: Payer: Self-pay | Admitting: Neurology

## 2013-05-30 ENCOUNTER — Ambulatory Visit (INDEPENDENT_AMBULATORY_CARE_PROVIDER_SITE_OTHER): Payer: 59 | Admitting: Neurology

## 2013-05-30 VITALS — BP 138/72 | HR 62 | Resp 18 | Ht 68.0 in | Wt 173.0 lb

## 2013-05-30 DIAGNOSIS — G309 Alzheimer's disease, unspecified: Secondary | ICD-10-CM

## 2013-05-30 DIAGNOSIS — I25810 Atherosclerosis of coronary artery bypass graft(s) without angina pectoris: Secondary | ICD-10-CM

## 2013-05-30 DIAGNOSIS — E538 Deficiency of other specified B group vitamins: Secondary | ICD-10-CM | POA: Diagnosis not present

## 2013-05-30 DIAGNOSIS — F0280 Dementia in other diseases classified elsewhere without behavioral disturbance: Secondary | ICD-10-CM | POA: Diagnosis not present

## 2013-05-30 MED ORDER — GALANTAMINE HYDROBROMIDE ER 16 MG PO CP24: 16.0000 mg | ORAL_CAPSULE | Freq: Every day | ORAL | Status: DC

## 2013-05-30 NOTE — Progress Notes (Signed)
The patient is seen in neurologic follow up regarding AD.  This patient is accompanied in the office by his spouse who supplements the history.   The patient is a 73 y.o. year cancel male who has had memory issues for about 7 years, since hisCABG in 2006.  The patient does not do the finances in the home.  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  The patient does drive. He is able to get to familiar places but he would not be able to get to unfamiliar places with directions.  The farthest he drives is 15 min from home.   There have not been any motor vehicle accidents in the recent years.  He has refused OT driving eval and it has therefore been our recommendation that he quit driving.  The patient does not cook.  They generally eat out.      The patient is  able to perform his own ADL's.  The patient is not able to distribute his own medications.  His wife has done his pill box preparation for at least 11 years, which is how long they have been married.  She thinks that he would not know to take medications without her.  The patients bladder and bowel are  under good control.  There have been some behavioral changes over the years.  The initatiation of Lexapro has helped the easy agitation. There have been no hallucinations.  He always recognizes his family.  He had no difficulty recognizing family over the holidays.  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  His wife was and is very upset about this.  The large majority of the day the patient is with his grandson, but his wife also states that his grandson would not monitor something like this sale.  10/21/12 update:  The patient has Alzheimer's dementia and is currently on Razadyne, 16 mg daily.  His wife did call me and asked if this was the cause of his weight loss.  I told her that, although it could be, I doubted it somewhat  but told her that she could go ahead and hold it.  He has been off of it since the end of June.  He also saw a gastroenterologist.  A CT of the abdomen and pelvis was performed.  There was rectosigmoid junction and wall thickening, but it was felt that it was due to bowel distention.  Nonetheless, the patient is to undergo a colonoscopy.  He reported that he also had a B12 level done by his gastroenterologist which was low.  I do not have a copy of that.  He has been drinking boost and his wife thinks that perhaps this is why his weight is now going up.  However, they both admit that he really does not eat well.  He does very little protein, choosing to eat Raisin Bran much of the time.  He is still driving, although minimally.  05/30/13 update:  Pt is accompanied by his wife who supplements the history.  Not seen him since august, 2014.  Pt remains on razadyne 16 mg daily.  Wife distributes medications but she always has.  Tried without the medication and then only on 8 mg and stomach issues didn't change.   Is still on lexapro for agitation.  Doing well in that regard.  Can get easily frustrated and will lock himself in a car and sit there until he is better.    His wife states that keeping the same routine is key.  The pts grandson is with the pt all day and then the grandson meets his wife and drops him off.  Drives to his grandsons less than 1/2 mile up the road.  Otherwise, no driving.  Previous medications:  Aricept (cramps in hands)                                       Exelon patch (personality change)                                       Namenda (d/c secondary to "no help")                                       Lexapro helps agitation  The patient has OSA but is noncompliant with CPAP.  He snores on his back.  He quit using it about 1 1/2 years ago.    No Known Allergies  Current Outpatient Prescriptions on File Prior to Visit  Medication Sig Dispense Refill  . aspirin 325 MG EC tablet Take  325 mg by mouth daily.        Marland Kitchen atorvastatin (LIPITOR) 40 MG tablet Take 1 tablet (40 mg total) by mouth daily.  90 tablet  3  . Clorazepate Dipotassium (TRANXENE-SD) 11.25 MG TB24 Take 11.25 mg by mouth as needed. Take 1/2 tablet as needed        . Cyanocobalamin (B-12 PO) Take 1 tablet by mouth daily.      Marland Kitchen escitalopram (LEXAPRO) 20 MG tablet Take 1 tablet (20 mg total) by mouth daily.  90 tablet  3  . esomeprazole (NEXIUM) 40 MG capsule Take 1 capsule (40 mg total) by mouth daily before breakfast.  90 capsule  3  . galantamine (RAZADYNE ER) 8 MG 24 hr capsule Take 1 capsule (8 mg total) by mouth daily with breakfast.  180 capsule  1  . Multiple Vitamin (MULTIVITAMIN) capsule Take 1 capsule by mouth daily.        . propranolol (INDERAL) 10 MG tablet Take 10 mg by mouth daily. Take one tablet by mouth at supper      . Tamsulosin HCl (FLOMAX) 0.4 MG CAPS Take 0.4 mg by mouth daily.        . nitroGLYCERIN (NITROSTAT) 0.4 MG SL tablet Place 1 tablet (0.4 mg total) under the tongue every 5 (five) minutes as needed.  25 tablet  8  . [DISCONTINUED] tadalafil (CIALIS) 10 MG tablet Take 10 mg by mouth daily as needed.         No current facility-administered medications on file prior to visit.    Past Medical History  Diagnosis Date  . Sleep apnea     noncompliant with CPAP  . Memory loss   . Heart murmur   . Syncope and collapse     No arrhythmia on loop recorder. Likely orthostatic hypotension  . Carotid artery disease     on the right side -60-79%  . Hyperlipidemia   . Coronary artery disease     prior bypass  grafting. Most recent Myoview 2011 demonstrated no ischemia and normal left ventricular function    Past Surgical History  Procedure Laterality Date  . Coronary artery bypass graft  2006  . Cardiac catheterization  2010    patent LIMA to LAD, SVG to D1, SVG to OM3, occluded SVG to a small D2. Normal EF  . Hand surgery    . Appendectomy      History   Social History  .  Marital Status: Married    Spouse Name: N/A    Number of Children: N/A  . Years of Education: N/A   Occupational History  . retired     [place] History Main Topics  . Smoking status: Never Smoker   . Smokeless tobacco: Never Used  . Alcohol Use: No  . Drug Use: No  . Sexual Activity: Not on file   Other Topics Concern  . Not on file   Social History Narrative  . No narrative on file    Family Status  Relation Status Death Age  . Mother Deceased     ? MI  . Father Deceased     kidney failure, CAD  . Sister Alive     13, alive and well  . Brother Deceased     26, melanoma, suicide, pneumonia  . Brother Alive     35, alive and well  . Child Alive     alive and well    ROS:  A complete 10 system ROS was obtained and was unremarkable except as above.   VITALS:   Filed Vitals:   05/30/13 1423  BP: 138/72  Pulse: 62  Resp: 18  Height: 5\' 8"  (1.727 m)  Weight: 173 lb (78.472 kg)   Wt Readings from Last 3 Encounters:  05/30/13 173 lb (78.472 kg)  05/16/13 175 lb (79.379 kg)  10/21/12 171 lb (77.565 kg)     HEENT:  Normocephalic, atraumatic. The mucous membranes are moist. The superficial temporal arteries are without ropiness or tenderness. Cardiovascular: Regular rate and rhythm. Lungs: Clear to auscultation bilaterally. Neck: There are no carotid bruits noted bilaterally.  NEUROLOGICAL:  Orientation:  His MoCA was 14/30 today.  A complete MMSE was performed today and he scored a 19/30.  Previously, he scored 17/30 Cranial nerves: There is good facial symmetry. The pupils are equal round and minimally reactive to light bilaterally. Funduscopic exam reveals clear disc margins bilaterally. Extraocular muscles are intact and visual fields are full to confrontational testing. Speech is fluent and clear. Soft palate rises symmetrically and there is no tongue deviation. Hearing is intact to conversational tone. Tone: Tone is good throughout. Sensation:  Sensation is intact to light touch throughout Coordination:  The patient has no difficulty with RAM's or FNF bilaterally. Motor: Strength is 5/5 in the bilateral upper and lower extremities. There is no pronator drift.  There are no fasciculations noted.  No results found for this basename: VITAMINB12      Imp/plan: 1.  Dementia, likely of the Alzheimers type.  I spent greater than 50% of this 35 minute visit in counseling with the patient and his wife.  We talked about the diagnosis.  We talked about safety and medical treatments.  -An OT driving eval is recommended if the patient wants to continue driving.  I recommended no driving and reiterated that today.  Safety discussed.  -continue the razadyne 16 mg ER daily.  Discussed retrying namenda but they decided to hold for now.    -  I recommended daily physical and mental exercises/learning new skills. 2.  Obstructive sleep apnea syndrome, noncompliant with CPAP.  -They understand morbidity and mortality associated with untreated sleep apnea. 3.  Possible B12 deficiency.  -He is on oral supplements and we will recheck that today. 4.  F/u in 1 year.

## 2013-05-30 NOTE — Patient Instructions (Signed)
1. Please send old FMLA forms to [phone] attn: Jade.  2. Follow up in 1 year.

## 2013-05-31 LAB — VITAMIN B12: VITAMIN B 12: 487 pg/mL (ref 211–911)

## 2013-06-01 ENCOUNTER — Telehealth: Payer: Self-pay | Admitting: Neurology

## 2013-06-01 NOTE — Telephone Encounter (Signed)
Pt's spouse called wanting an update on the FMLA paperwork that Dr. Carles Collet is suppose to fill out For her spouse and pt states that she can not locate the old FMLA paperwork that Dr. Carles Collet requested for her to find.  [phone] work if you cant reach her cell.

## 2013-06-01 NOTE — Telephone Encounter (Signed)
Dr Tat completed today and I returned to [facility] (I put this on your desk) to give to patient.

## 2013-06-02 DIAGNOSIS — Z0279 Encounter for issue of other medical certificate: Secondary | ICD-10-CM

## 2013-06-05 DIAGNOSIS — H33309 Unspecified retinal break, unspecified eye: Secondary | ICD-10-CM | POA: Diagnosis not present

## 2013-08-04 DIAGNOSIS — L821 Other seborrheic keratosis: Secondary | ICD-10-CM | POA: Diagnosis not present

## 2013-09-25 ENCOUNTER — Telehealth: Payer: Self-pay | Admitting: Neurology

## 2013-09-25 NOTE — Telephone Encounter (Signed)
Tranxene is definitely not indicated in patients with dementia and can increase confusion.  Is this something that really requires medication to be calmed down.  Can he not be redirected?  Hate to have to use meds unless I absolutely have to because of side effects in this patient population.

## 2013-09-25 NOTE — Telephone Encounter (Signed)
Patient wife states he is having episodes of getting upset for no reason that are happening frequently. She wants to know if there is anything to do about this. She states he used to take Tranxene and is wondering if this is something they could do. Please advise.

## 2013-09-25 NOTE — Telephone Encounter (Signed)
Pt wife glenna would like to talk to someone about please call her at [phone] or [phone]

## 2013-09-26 NOTE — Telephone Encounter (Signed)
Patient states the mood changes come in shorts bursts and she is able to re-direct right now. She will let us know if anything changes, but expressed understanding about the side effects of the medications and does not want to increase confusion. She will call as needed.

## 2013-12-22 DIAGNOSIS — Z23 Encounter for immunization: Secondary | ICD-10-CM | POA: Diagnosis not present

## 2014-02-02 ENCOUNTER — Other Ambulatory Visit: Payer: Self-pay

## 2014-02-02 MED ORDER — PROPRANOLOL HCL 10 MG PO TABS: 10.0000 mg | ORAL_TABLET | Freq: Every day | ORAL | Status: DC

## 2014-04-03 ENCOUNTER — Encounter (INDEPENDENT_AMBULATORY_CARE_PROVIDER_SITE_OTHER): Payer: 59

## 2014-04-03 ENCOUNTER — Encounter: Payer: Self-pay | Admitting: Cardiovascular Disease

## 2014-04-03 ENCOUNTER — Ambulatory Visit: Payer: 59 | Admitting: Cardiovascular Disease

## 2014-04-03 ENCOUNTER — Ambulatory Visit (INDEPENDENT_AMBULATORY_CARE_PROVIDER_SITE_OTHER): Payer: 59 | Admitting: Cardiovascular Disease

## 2014-04-03 VITALS — BP 146/68 | HR 57 | Ht 68.0 in | Wt 183.0 lb

## 2014-04-03 DIAGNOSIS — I25810 Atherosclerosis of coronary artery bypass graft(s) without angina pectoris: Secondary | ICD-10-CM | POA: Diagnosis not present

## 2014-04-03 DIAGNOSIS — I779 Disorder of arteries and arterioles, unspecified: Secondary | ICD-10-CM

## 2014-04-03 DIAGNOSIS — I739 Peripheral vascular disease, unspecified: Principal | ICD-10-CM

## 2014-04-03 DIAGNOSIS — I6523 Occlusion and stenosis of bilateral carotid arteries: Secondary | ICD-10-CM

## 2014-04-03 DIAGNOSIS — E785 Hyperlipidemia, unspecified: Secondary | ICD-10-CM | POA: Diagnosis not present

## 2014-04-03 NOTE — Assessment & Plan Note (Signed)
He has moderate bilateral 60-79% stenosis. Check carotid doppler.

## 2014-04-03 NOTE — Assessment & Plan Note (Signed)
Lab Results  Component Value Date   CHOL 129 05/16/2013   HDL 54.60 05/16/2013   LDLCALC 67 05/16/2013   TRIG 37.0 05/16/2013   CHOLHDL 2 05/16/2013   LDL was at target. Continue Atorvastatin.

## 2014-04-03 NOTE — Patient Instructions (Signed)
Your physician wants you to follow-up in: 1 YEAR with Dr Fletcher Anon.  You will receive a reminder letter in the mail two months in advance. If you don't receive a letter, please call our office to schedule the follow-up appointment.  Your physician recommends that you continue on your current medications as directed. Please refer to the Current Medication list given to you today.

## 2014-04-03 NOTE — Progress Notes (Signed)
PCP: Dr. Bea Graff  HPI  This is a 74 year old man who is here today for followup visit. He has known history of coronary artery disease status post coronary artery bypass graft surgery in 2006. He also has moderate asymptomatic right carotid stenosis as well as recurrent syncope without documented arrhythmia on loop recorder. He has known history of postprandial palpitations due to frequent PVCs and ventricular bigeminy. This was treated with propranolol.   The patient has been doing very well overall without reported chest pain or dyspnea. He has not had any syncope or presyncope. He continues to suffer from memory problems due to moderate to severe dementia.  He helped his son move over the weekend with no exertional symptoms.   No Known Allergies   Current Outpatient Prescriptions on File Prior to Visit  Medication Sig Dispense Refill  . aspirin 325 MG EC tablet Take 325 mg by mouth daily.      Marland Kitchen atorvastatin (LIPITOR) 40 MG tablet Take 1 tablet (40 mg total) by mouth daily. 90 tablet 3  . Clorazepate Dipotassium (TRANXENE-SD) 11.25 MG TB24 Take 11.25 mg by mouth as needed. Take 1/2 tablet as needed      . Cyanocobalamin (B-12 PO) Take 1 tablet by mouth daily.    Marland Kitchen escitalopram (LEXAPRO) 20 MG tablet Take 1 tablet (20 mg total) by mouth daily. 90 tablet 3  . esomeprazole (NEXIUM) 40 MG capsule Take 1 capsule (40 mg total) by mouth daily before breakfast. 90 capsule 3  . Multiple Vitamin (MULTIVITAMIN) capsule Take 1 capsule by mouth daily.      . nitroGLYCERIN (NITROSTAT) 0.4 MG SL tablet Place 1 tablet (0.4 mg total) under the tongue every 5 (five) minutes as needed. 25 tablet 8  . propranolol (INDERAL) 10 MG tablet Take 1 tablet (10 mg total) by mouth daily. Take one tablet by mouth at supper 90 tablet 1  . Tamsulosin HCl (FLOMAX) 0.4 MG CAPS Take 0.4 mg by mouth daily.      . [DISCONTINUED] tadalafil (CIALIS) 10 MG tablet Take 10 mg by mouth daily as needed.       No current  facility-administered medications on file prior to visit.     Past Medical History  Diagnosis Date  . Sleep apnea     noncompliant with CPAP  . Memory loss   . Heart murmur   . Syncope and collapse     No arrhythmia on loop recorder. Likely orthostatic hypotension  . Carotid artery disease     on the right side -60-79%  . Hyperlipidemia   . Coronary artery disease     prior bypass grafting. Most recent Myoview 2011 demonstrated no ischemia and normal left ventricular function     Past Surgical History  Procedure Laterality Date  . Coronary artery bypass graft  2006  . Cardiac catheterization  2010    patent LIMA to LAD, SVG to D1, SVG to OM3, occluded SVG to a small D2. Normal EF  . Hand surgery    . Appendectomy       No family history on file.   History   Social History  . Marital Status: Married    Spouse Name: N/A    Number of Children: N/A  . Years of Education: N/A   Occupational History  . retired     [place] History Main Topics  . Smoking status: Never Smoker   . Smokeless tobacco: Never Used  . Alcohol Use: No  .  Drug Use: No  . Sexual Activity: Not on file   Other Topics Concern  . Not on file   Social History Narrative      PHYSICAL EXAM   BP 146/68 mmHg  Pulse 57  Ht 5\' 8"  (1.727 m)  Wt 183 lb (83.008 kg)  BMI 27.83 kg/m2  Constitutional: He is oriented to person, place, and time. He appears well-developed and well-nourished. No distress.  HENT: No nasal discharge.  Head: Normocephalic and atraumatic.  Eyes: Pupils are equal, round, and reactive to light. Right eye exhibits no discharge. Left eye exhibits no discharge.  Neck: Normal range of motion. Neck supple. No JVD present. No thyromegaly present. There is right carotid bruit. Cardiovascular: Normal rate, regular rhythm, normal heart sounds and intact distal pulses. Exam reveals no gallop and no friction rub.  Visit 2/6 systolic ejection murmur in the aortic  area Pulmonary/Chest: Effort normal and breath sounds normal. No stridor. No respiratory distress. He has no wheezes. He has no rales. He exhibits no tenderness.  Abdominal: Soft. Bowel sounds are normal. He exhibits no distension. There is no tenderness. There is no rebound and no guarding.  Musculoskeletal: Normal range of motion. He exhibits no edema and no tenderness.  Neurological: He is alert and oriented to person, place, and time. Coordination normal.  Skin: Skin is warm and dry. No rash noted. He is not diaphoretic. No erythema. No pallor.  Psychiatric: He has a normal mood and affect. His behavior is normal. Judgment and thought content normal.     EKG: sinus bradycardia, LVH with repolarization abnormalities.   ASSESSMENT AND PLAN

## 2014-04-03 NOTE — Assessment & Plan Note (Signed)
He is doing very well with no symptoms suggestive of angina. Continue medical therapy. He has an abnormal EKG but is unchanged from most recent one.

## 2014-04-05 ENCOUNTER — Telehealth: Payer: Self-pay | Admitting: *Deleted

## 2014-04-05 DIAGNOSIS — I779 Disorder of arteries and arterioles, unspecified: Secondary | ICD-10-CM

## 2014-04-05 DIAGNOSIS — I739 Peripheral vascular disease, unspecified: Principal | ICD-10-CM

## 2014-04-05 NOTE — Telephone Encounter (Signed)
-----   Message from Wellington Hampshire, MD sent at 04/04/2014  3:38 PM EST ----- Inform patient that carotid doppler showed stable carotid stenosis. Repeat carotid doppler in 1 year.

## 2014-04-23 DIAGNOSIS — Z0279 Encounter for issue of other medical certificate: Secondary | ICD-10-CM

## 2014-05-18 ENCOUNTER — Encounter: Payer: Self-pay | Admitting: Neurology

## 2014-05-18 ENCOUNTER — Ambulatory Visit (INDEPENDENT_AMBULATORY_CARE_PROVIDER_SITE_OTHER): Payer: 59 | Admitting: Neurology

## 2014-05-18 VITALS — BP 154/72 | HR 64 | Ht 68.0 in | Wt 177.0 lb

## 2014-05-18 DIAGNOSIS — I25810 Atherosclerosis of coronary artery bypass graft(s) without angina pectoris: Secondary | ICD-10-CM

## 2014-05-18 DIAGNOSIS — G309 Alzheimer's disease, unspecified: Secondary | ICD-10-CM

## 2014-05-18 DIAGNOSIS — F0280 Dementia in other diseases classified elsewhere without behavioral disturbance: Secondary | ICD-10-CM

## 2014-05-18 MED ORDER — MEMANTINE HCL 10 MG PO TABS: 10.0000 mg | ORAL_TABLET | Freq: Two times a day (BID) | ORAL | Status: DC

## 2014-05-18 MED ORDER — MEMANTINE HCL 28 X 5 MG & 21 X 10 MG PO TABS: ORAL_TABLET | ORAL | Status: DC

## 2014-05-18 NOTE — Patient Instructions (Signed)
1.  Start namenda as follows:  5 mg tablet daily for a week, then 5 mg twice a day for a week, then 10 mg in the AM and 5 mg at night for a week, and then 10 mg twice a day thereafter

## 2014-05-18 NOTE — Progress Notes (Signed)
The patient is seen in neurologic follow up regarding AD.  This patient is accompanied in the office by his spouse who supplements the history.   The patient is a 74 y.o. year cancel male who has had memory issues for about 7 years, since hisCABG in 2006.  The patient does not do the finances in the home.  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  The patient does drive. He is able to get to familiar places but he would not be able to get to unfamiliar places with directions.  The farthest he drives is 15 min from home.   There have not been any motor vehicle accidents in the recent years.  He has refused OT driving eval and it has therefore been our recommendation that he quit driving.  The patient does not cook.  They generally eat out.      The patient is  able to perform his own ADL's.  The patient is not able to distribute his own medications.  His wife has done his pill box preparation for at least 11 years, which is how long they have been married.  She thinks that he would not know to take medications without her.  The patients bladder and bowel are  under good control.  There have been some behavioral changes over the years.  The initatiation of Lexapro has helped the easy agitation. There have been no hallucinations.  He always recognizes his family.  He had no difficulty recognizing family over the holidays.  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  His wife was and is very upset about this.  The large majority of the day the patient is with his grandson, but his wife also states that his grandson would not monitor something like this sale.  10/21/12 update:  The patient has Alzheimer's dementia and is currently on Razadyne, 16 mg daily.  His wife did call me and asked if this was the cause of his weight loss.  I told her that, although it could be, I doubted it somewhat  but told her that she could go ahead and hold it.  He has been off of it since the end of June.  He also saw a gastroenterologist.  A CT of the abdomen and pelvis was performed.  There was rectosigmoid junction and wall thickening, but it was felt that it was due to bowel distention.  Nonetheless, the patient is to undergo a colonoscopy.  He reported that he also had a B12 level done by his gastroenterologist which was low.  I do not have a copy of that.  He has been drinking boost and his wife thinks that perhaps this is why his weight is now going up.  However, they both admit that he really does not eat well.  He does very little protein, choosing to eat Raisin Bran much of the time.  He is still driving, although minimally.  05/30/13 update:  Pt is accompanied by his wife who supplements the history.  Not seen him since august, 2014.  Pt remains on razadyne 16 mg daily.  Wife distributes medications but she always has.  Tried without the medication and then only on 8 mg and stomach issues didn't change.   Is still on lexapro for agitation.  Doing well in that regard.  Can get easily frustrated and will lock himself in a car and sit there until he is better.    His wife states that keeping the same routine is key.  The pts grandson is with the pt all day and then the grandson meets his wife and drops him off.  Drives to his grandsons less than 1/2 mile up the road.  Otherwise, no driving.  05/18/14 update:  The patient is accompanied by his wife, who supplements the history.  Patients wife thinks that his memory continues to deteriorate.  She thinks that he is having intermittent rare behavioral issues.  For example, she took him to the bank and he wanted money out that they didn't have.  He got out of the car and started walking down a busy highway and wouldn't get back in the car.  She had to call the police and her son to come redirect him.  He didn't remember the incident the following day and couldn't  remember why he had a blister on the foot (from walking down the highway).   He was on the galantamine 16 mg but he is back off of it because he had nausea.  He has been off of it for at least 6 months.   He still takes care of ADL's.  His grandson stays with him during the day and they work on the farm together and then his grandson takes him to meet his wife after work.   He is on oral B12 and last year his B12 level was 487.  Cardiology is following him for carotid stenosis and he did have an ultrasound in January, 2016 that I reviewed.  Ventricle carotid artery stenosis was stable at 60-79% stenosis and left internal carotid artery stenosis was improved at 1-39% stenosis.  Previous medications:  Aricept (cramps in hands)                                       Exelon patch (personality change)                                       Namenda (d/c secondary to "no help")                                       Lexapro helps agitation       razadyne (GI upset)  The patient has OSA but is noncompliant with CPAP.  He snores on his back.  He quit using it about 1 1/2 years ago.    No Known Allergies  Current Outpatient Prescriptions on File Prior to Visit  Medication Sig Dispense Refill  . aspirin 325 MG EC tablet Take 325 mg by mouth daily.      Marland Kitchen atorvastatin (LIPITOR) 40 MG tablet Take 1 tablet (40 mg total) by mouth daily. 90 tablet 3  . Clorazepate Dipotassium (TRANXENE-SD) 11.25 MG TB24 Take 11.25 mg by mouth as needed. Take 1/2 tablet as needed      . Cyanocobalamin (B-12 PO) Take 1 tablet by mouth daily.    Marland Kitchen escitalopram (LEXAPRO) 20 MG tablet Take 1 tablet (20 mg total) by mouth daily. 90 tablet 3  . esomeprazole (NEXIUM) 40 MG capsule Take 1 capsule (40  mg total) by mouth daily before breakfast. 90 capsule 3  . Multiple Vitamin (MULTIVITAMIN) capsule Take 1 capsule by mouth daily.      . nitroGLYCERIN (NITROSTAT) 0.4 MG SL tablet Place 1 tablet (0.4 mg total) under the tongue every 5 (five)  minutes as needed. (Patient not taking: Reported on 05/18/2014) 25 tablet 8  . propranolol (INDERAL) 10 MG tablet Take 1 tablet (10 mg total) by mouth daily. Take one tablet by mouth at supper 90 tablet 1  . Tamsulosin HCl (FLOMAX) 0.4 MG CAPS Take 0.4 mg by mouth daily.      . [DISCONTINUED] tadalafil (CIALIS) 10 MG tablet Take 10 mg by mouth daily as needed.       No current facility-administered medications on file prior to visit.    Past Medical History  Diagnosis Date  . Sleep apnea     noncompliant with CPAP  . Memory loss   . Heart murmur   . Syncope and collapse     No arrhythmia on loop recorder. Likely orthostatic hypotension  . Carotid artery disease     on the right side -60-79%  . Hyperlipidemia   . Coronary artery disease     prior bypass grafting. Most recent Myoview 2011 demonstrated no ischemia and normal left ventricular function    Past Surgical History  Procedure Laterality Date  . Coronary artery bypass graft  2006  . Cardiac catheterization  2010    patent LIMA to LAD, SVG to D1, SVG to OM3, occluded SVG to a small D2. Normal EF  . Hand surgery    . Appendectomy      History   Social History  . Marital Status: Married    Spouse Name: N/A  . Number of Children: N/A  . Years of Education: N/A   Occupational History  . retired     [place] History Main Topics  . Smoking status: Never Smoker   . Smokeless tobacco: Never Used  . Alcohol Use: No  . Drug Use: No  . Sexual Activity: Not on file   Other Topics Concern  . Not on file   Social History Narrative    Family Status  Relation Status Death Age  . Mother Deceased     ? MI  . Father Deceased     kidney failure, CAD  . Sister Alive     20, alive and well  . Brother Deceased     51, melanoma, suicide, pneumonia  . Brother Alive     45, alive and well  . Child Alive     alive and well    ROS:  A complete 10 system ROS was obtained and was unremarkable except as above.    VITALS:   Filed Vitals:   05/18/14 1101  BP: 154/72  Pulse: 64  Height: 5\' 8"  (1.727 m)  Weight: 177 lb (80.287 kg)   Wt Readings from Last 3 Encounters:  05/18/14 177 lb (80.287 kg)  04/03/14 183 lb (83.008 kg)  05/30/13 173 lb (78.472 kg)     HEENT:  Normocephalic, atraumatic. The mucous membranes are moist. The superficial temporal arteries are without ropiness or tenderness. Cardiovascular: Regular rate and rhythm. Lungs: Clear to auscultation bilaterally. Neck: There are no carotid bruits noted bilaterally.  NEUROLOGICAL:  Orientation:  MMSE on 05/18/14 was actually improved at 21/30.    A complete MMSE was performed last year and he scored a 19/30 and the year prior he scored 17/30  Cranial nerves: There is good facial symmetry. The pupils are equal round and minimally reactive to light bilaterally. Funduscopic exam reveals clear disc margins bilaterally. Extraocular muscles are intact and visual fields are full to confrontational testing. Speech is fluent and clear. Soft palate rises symmetrically and there is no tongue deviation. Hearing is intact to conversational tone. Tone: Tone is good throughout. Sensation: Sensation is intact to light touch throughout Coordination:  The patient has no difficulty with RAM's or FNF bilaterally. Motor: Strength is 5/5 in the bilateral upper and lower extremities. There is no pronator drift.  There are no fasciculations noted.  Lab Results  Component Value Date   VITAMINB12 487 05/30/2013      Imp/plan: 1.  Dementia, likely of the Alzheimers type.  I spent greater than 50% of this 25 minute visit in counseling with the patient and his wife.  We talked about the diagnosis.  We talked about safety and medical treatments.  -An OT driving eval is recommended if the patient wants to continue driving.  I recommended no driving and reiterated that today.  He isn't driving much.   Safety discussed.  -off of razadyne because of nausea.  Retry  namenda.     -I recommended daily physical and mental exercises/learning new skills. 2.  Obstructive sleep apnea syndrome, noncompliant with CPAP.  -They understand morbidity and mortality associated with untreated sleep apnea. 3.  Possible B12 deficiency.  -He is on oral supplements and we will recheck that today. 4.  F/u in 1 year.

## 2014-06-18 ENCOUNTER — Telehealth: Payer: Self-pay | Admitting: Neurology

## 2014-06-18 ENCOUNTER — Other Ambulatory Visit: Payer: Self-pay | Admitting: Neurology

## 2014-06-18 NOTE — Telephone Encounter (Signed)
Patient's wife made aware RX was already sent to [facility] pharmacy.

## 2014-06-18 NOTE — Telephone Encounter (Signed)
Pt wife [name] called and states that pt needs a refill on his memantine called into the [facility] on [address]  Her phone number is [phone]

## 2014-09-13 ENCOUNTER — Encounter: Payer: Self-pay | Admitting: Cardiovascular Disease

## 2014-10-22 ENCOUNTER — Telehealth: Payer: Self-pay | Admitting: *Deleted

## 2014-10-22 NOTE — Telephone Encounter (Signed)
Lmom to call our office. Time to schedule a carotid u/s (6 mth f/u). 

## 2014-12-04 ENCOUNTER — Telehealth: Payer: Self-pay | Admitting: *Deleted

## 2014-12-04 NOTE — Telephone Encounter (Signed)
Lmom to call our office for an appointment. Time to schedule a carotid u/s (6 mth f/u).

## 2014-12-05 ENCOUNTER — Telehealth (HOSPITAL_COMMUNITY): Payer: Self-pay | Admitting: *Deleted

## 2014-12-05 DIAGNOSIS — R61 Generalized hyperhidrosis: Secondary | ICD-10-CM | POA: Diagnosis not present

## 2014-12-05 DIAGNOSIS — Z7982 Long term (current) use of aspirin: Secondary | ICD-10-CM | POA: Diagnosis not present

## 2014-12-05 DIAGNOSIS — I10 Essential (primary) hypertension: Secondary | ICD-10-CM | POA: Insufficient documentation

## 2014-12-05 DIAGNOSIS — R079 Chest pain, unspecified: Secondary | ICD-10-CM | POA: Diagnosis not present

## 2014-12-05 DIAGNOSIS — E785 Hyperlipidemia, unspecified: Secondary | ICD-10-CM | POA: Diagnosis not present

## 2014-12-05 DIAGNOSIS — I2510 Atherosclerotic heart disease of native coronary artery without angina pectoris: Secondary | ICD-10-CM | POA: Insufficient documentation

## 2014-12-05 DIAGNOSIS — F0390 Unspecified dementia without behavioral disturbance: Secondary | ICD-10-CM | POA: Insufficient documentation

## 2014-12-05 DIAGNOSIS — Z79899 Other long term (current) drug therapy: Secondary | ICD-10-CM | POA: Diagnosis not present

## 2014-12-05 DIAGNOSIS — R0602 Shortness of breath: Secondary | ICD-10-CM | POA: Diagnosis not present

## 2014-12-05 DIAGNOSIS — R001 Bradycardia, unspecified: Secondary | ICD-10-CM | POA: Diagnosis not present

## 2014-12-05 DIAGNOSIS — I771 Stricture of artery: Secondary | ICD-10-CM | POA: Diagnosis not present

## 2014-12-05 DIAGNOSIS — Z951 Presence of aortocoronary bypass graft: Secondary | ICD-10-CM | POA: Diagnosis not present

## 2014-12-05 DIAGNOSIS — R0789 Other chest pain: Secondary | ICD-10-CM | POA: Diagnosis not present

## 2014-12-05 NOTE — Telephone Encounter (Signed)
Spoke with [name]  And she was given detailed instructions per Myocardial Perfusion Study Information Sheet for test on 12/05/14 at 715 for her husband.. She was notified to arrive 15 minutes early and that it is imperative to arrive on time for appointment to keep from having the test rescheduled.  If you need to cancel or reschedule your appointment, please call the office within 24 hours of your appointment. Failure to do so may result in a cancellation of your appointment, and a $50 no show fee. Patient verbalized understanding. Hubbard Robinson, RN

## 2014-12-06 ENCOUNTER — Ambulatory Visit (HOSPITAL_COMMUNITY): Payer: 59 | Attending: Cardiology

## 2014-12-06 ENCOUNTER — Other Ambulatory Visit: Payer: Self-pay | Admitting: Cardiovascular Disease

## 2014-12-06 ENCOUNTER — Encounter: Payer: Self-pay | Admitting: Cardiovascular Disease

## 2014-12-06 DIAGNOSIS — I779 Disorder of arteries and arterioles, unspecified: Secondary | ICD-10-CM | POA: Diagnosis not present

## 2014-12-06 DIAGNOSIS — R079 Chest pain, unspecified: Secondary | ICD-10-CM

## 2014-12-06 DIAGNOSIS — R9439 Abnormal result of other cardiovascular function study: Secondary | ICD-10-CM | POA: Diagnosis not present

## 2014-12-06 LAB — MYOCARDIAL PERFUSION IMAGING
CHL CUP NUCLEAR SDS: 2
CHL CUP NUCLEAR SRS: 5
LHR: 0.25
LV sys vol: 53 mL
LVDIAVOL: 109 mL
NUC STRESS TID: 1.13
Peak HR: 77 {beats}/min
Rest HR: 49 {beats}/min
SSS: 7

## 2014-12-06 MED ORDER — REGADENOSON 0.4 MG/5ML IV SOLN
0.4000 mg | Freq: Once | INTRAVENOUS | Status: AC
  Administered 2014-12-06: 0.4 mg via INTRAVENOUS

## 2014-12-06 MED ORDER — TECHNETIUM TC 99M SESTAMIBI GENERIC - CARDIOLITE
10.6000 | Freq: Once | INTRAVENOUS | Status: AC | PRN
  Administered 2014-12-06: 11 via INTRAVENOUS

## 2014-12-06 MED ORDER — TECHNETIUM TC 99M SESTAMIBI GENERIC - CARDIOLITE
32.3000 | Freq: Once | INTRAVENOUS | Status: AC | PRN
  Administered 2014-12-06: 32.3 via INTRAVENOUS

## 2014-12-07 ENCOUNTER — Telehealth: Payer: Self-pay | Admitting: *Deleted

## 2014-12-07 NOTE — Telephone Encounter (Signed)
Pt wife calling stating she got a call to make a 6 m fu for Carotid , and they remember Dr Fletcher Anon stating it was good for a year for carotid.  They would like to verify this.  They also got another call today but not sure if we were calling about stress test results for patient had this done yesterday They would like to know soon on this, for if they need to schedule an apt they can go ahead and do so but would perfer to go to [place] [facility] will be [facility], it is okay to call wife on cell [phone]    Please advise.

## 2014-12-07 NOTE — Telephone Encounter (Signed)
Given that the carotid stenosis has been stable, I recommended it to be repeated in 1 year instead of 6 months.

## 2014-12-10 NOTE — Telephone Encounter (Signed)
S/w pt wife, [name], of Dr. Tyrell Antonio recommendations.  Wife agreeable to plan with no further questions.

## 2014-12-25 ENCOUNTER — Telehealth: Payer: Self-pay | Admitting: *Deleted

## 2014-12-25 DIAGNOSIS — F0280 Dementia in other diseases classified elsewhere without behavioral disturbance: Secondary | ICD-10-CM | POA: Diagnosis not present

## 2014-12-25 DIAGNOSIS — E785 Hyperlipidemia, unspecified: Secondary | ICD-10-CM | POA: Diagnosis not present

## 2014-12-25 DIAGNOSIS — J45909 Unspecified asthma, uncomplicated: Secondary | ICD-10-CM | POA: Diagnosis not present

## 2014-12-25 DIAGNOSIS — G309 Alzheimer's disease, unspecified: Secondary | ICD-10-CM | POA: Diagnosis not present

## 2014-12-25 DIAGNOSIS — F329 Major depressive disorder, single episode, unspecified: Secondary | ICD-10-CM | POA: Diagnosis not present

## 2014-12-25 DIAGNOSIS — Z951 Presence of aortocoronary bypass graft: Secondary | ICD-10-CM | POA: Diagnosis not present

## 2014-12-25 DIAGNOSIS — N400 Enlarged prostate without lower urinary tract symptoms: Secondary | ICD-10-CM | POA: Diagnosis not present

## 2014-12-25 DIAGNOSIS — I10 Essential (primary) hypertension: Secondary | ICD-10-CM | POA: Diagnosis not present

## 2014-12-25 DIAGNOSIS — G4730 Sleep apnea, unspecified: Secondary | ICD-10-CM | POA: Diagnosis not present

## 2014-12-25 DIAGNOSIS — H2512 Age-related nuclear cataract, left eye: Secondary | ICD-10-CM | POA: Diagnosis not present

## 2014-12-25 NOTE — Telephone Encounter (Signed)
Lmom to call our office. Due for a carotid u/s (6 mth f/u).

## 2015-01-09 ENCOUNTER — Encounter: Payer: Self-pay | Admitting: *Deleted

## 2015-03-06 ENCOUNTER — Other Ambulatory Visit: Payer: Self-pay | Admitting: Cardiovascular Disease

## 2015-03-06 DIAGNOSIS — I6523 Occlusion and stenosis of bilateral carotid arteries: Secondary | ICD-10-CM

## 2015-03-26 MED FILL — ATORVASTATIN 40 MG TABLET: 40 | 90 days supply | Qty: 90 | Fill #1

## 2015-04-08 DIAGNOSIS — H25811 Combined forms of age-related cataract, right eye: Secondary | ICD-10-CM | POA: Diagnosis not present

## 2015-04-12 ENCOUNTER — Telehealth: Payer: Self-pay | Admitting: Cardiovascular Disease

## 2015-04-12 NOTE — Telephone Encounter (Signed)
3 attempts to schedule fu from recall list. lmov to call office .  Deleting recall .   °

## 2015-05-13 DIAGNOSIS — Z029 Encounter for administrative examinations, unspecified: Secondary | ICD-10-CM

## 2015-05-17 ENCOUNTER — Ambulatory Visit: Payer: Medicare Other | Admitting: Neurology

## 2015-05-17 ENCOUNTER — Encounter: Payer: Self-pay | Admitting: Cardiovascular Disease

## 2015-05-17 ENCOUNTER — Ambulatory Visit (INDEPENDENT_AMBULATORY_CARE_PROVIDER_SITE_OTHER): Payer: 59 | Admitting: Neurology

## 2015-05-17 ENCOUNTER — Encounter: Payer: Self-pay | Admitting: Neurology

## 2015-05-17 ENCOUNTER — Ambulatory Visit (INDEPENDENT_AMBULATORY_CARE_PROVIDER_SITE_OTHER): Payer: 59 | Admitting: Cardiovascular Disease

## 2015-05-17 ENCOUNTER — Ambulatory Visit: Payer: 59

## 2015-05-17 VITALS — BP 134/80 | HR 57 | Ht 68.0 in | Wt 177.0 lb

## 2015-05-17 VITALS — BP 120/60 | HR 63 | Ht 68.0 in | Wt 178.5 lb

## 2015-05-17 DIAGNOSIS — I25810 Atherosclerosis of coronary artery bypass graft(s) without angina pectoris: Secondary | ICD-10-CM | POA: Diagnosis not present

## 2015-05-17 DIAGNOSIS — R002 Palpitations: Secondary | ICD-10-CM | POA: Diagnosis not present

## 2015-05-17 DIAGNOSIS — E538 Deficiency of other specified B group vitamins: Secondary | ICD-10-CM

## 2015-05-17 DIAGNOSIS — F0280 Dementia in other diseases classified elsewhere without behavioral disturbance: Secondary | ICD-10-CM | POA: Diagnosis not present

## 2015-05-17 DIAGNOSIS — R55 Syncope and collapse: Secondary | ICD-10-CM

## 2015-05-17 DIAGNOSIS — G301 Alzheimer's disease with late onset: Secondary | ICD-10-CM

## 2015-05-17 DIAGNOSIS — I6523 Occlusion and stenosis of bilateral carotid arteries: Secondary | ICD-10-CM

## 2015-05-17 NOTE — Progress Notes (Signed)
The patient is seen in neurologic follow up regarding AD.  This patient is accompanied in the office by his spouse who supplements the history.   The patient is a 75 y.o. year cancel male who has had memory issues for about 7 years, since hisCABG in 2006.  The patient does not do the finances in the home.  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  The patient does drive. He is able to get to familiar places but he would not be able to get to unfamiliar places with directions.  The farthest he drives is 15 min from home.   There have not been any motor vehicle accidents in the recent years.  He has refused OT driving eval and it has therefore been our recommendation that he quit driving.  The patient does not cook.  They generally eat out.      The patient is  able to perform his own ADL's.  The patient is not able to distribute his own medications.  His wife has done his pill box preparation for at least 11 years, which is how long they have been married.  She thinks that he would not know to take medications without her.  The patients bladder and bowel are  under good control.  There have been some behavioral changes over the years.  The initatiation of Lexapro has helped the easy agitation. There have been no hallucinations.  He always recognizes his family.  He had no difficulty recognizing family over the holidays.  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  His wife was and is very upset about this.  The large majority of the day the patient is with his grandson, but his wife also states that his grandson would not monitor something like this sale.  10/21/12 update:  The patient has Alzheimer's dementia and is currently on Razadyne, 16 mg daily.  His wife did call me and asked if this was the cause of his weight loss.  I told her that, although it could be, I doubted it somewhat  but told her that she could go ahead and hold it.  He has been off of it since the end of June.  He also saw a gastroenterologist.  A CT of the abdomen and pelvis was performed.  There was rectosigmoid junction and wall thickening, but it was felt that it was due to bowel distention.  Nonetheless, the patient is to undergo a colonoscopy.  He reported that he also had a B12 level done by his gastroenterologist which was low.  I do not have a copy of that.  He has been drinking boost and his wife thinks that perhaps this is why his weight is now going up.  However, they both admit that he really does not eat well.  He does very little protein, choosing to eat Raisin Bran much of the time.  He is still driving, although minimally.  05/30/13 update:  Pt is accompanied by his wife who supplements the history.  Not seen him since august, 2014.  Pt remains on razadyne 16 mg daily.  Wife distributes medications but she always has.  Tried without the medication and then only on 8 mg and stomach issues didn't change.   Is still on lexapro for agitation.  Doing well in that regard.  Can get easily frustrated and will lock himself in a car and sit there until he is better.    His wife states that keeping the same routine is key.  The pts grandson is with the pt all day and then the grandson meets his wife and drops him off.  Drives to his grandsons less than 1/2 mile up the road.  Otherwise, no driving.  05/18/14 update:  The patient is accompanied by his wife, who supplements the history.  Patients wife thinks that his memory continues to deteriorate.  She thinks that he is having intermittent rare behavioral issues.  For example, she took him to the bank and he wanted money out that they didn't have.  He got out of the car and started walking down a busy highway and wouldn't get back in the car.  She had to call the police and her son to come redirect him.  He didn't remember the incident the following day and couldn't  remember why he had a blister on the foot (from walking down the highway).   He was on the galantamine 16 mg but he is back off of it because he had nausea.  He has been off of it for at least 6 months.   He still takes care of ADL's.  His grandson stays with him during the day and they work on the farm together and then his grandson takes him to meet his wife after work.   He is on oral B12 and last year his B12 level was 487.  Cardiology is following him for carotid stenosis and he did have an ultrasound in January, 2016 that I reviewed.  Ventricle carotid artery stenosis was stable at 60-79% stenosis and left internal carotid artery stenosis was improved at 1-39% stenosis.  05/17/15 update:  The patient is accompanied by his wife, who supplements the history. Pt restarted namenda started since last visit.  He is doing much better than last visit.  Less irritated.  Wife thinks that is a combination of many things.  Some of that is the patient just seems less irritated.  Some of that is that she is learning to deal with it better.  In addition, they are no longer trying to keep the diagnosis to themselves and the community seems to be helping and pulling together to watch out for the patient.  His grandson stays with him during the day.  The patient does not cook for himself.  His wife administers his Namenda in the morning and then gives him the remainder of his pills before bedtime.  He does not drive.  No hallucinations.  The patient has a carotid ultrasound scheduled later this afternoon.  Previous medications:  Aricept (cramps in hands)                                       Exelon patch (personality change)                                       Namenda (d/c secondary to "no help")                                       Lexapro helps agitation  razadyne (GI upset)  The patient has OSA but is noncompliant with CPAP.  He snores on his back.  He quit using it about 1 1/2 years ago.    No Known  Allergies  Current Outpatient Prescriptions on File Prior to Visit  Medication Sig Dispense Refill  . aspirin 325 MG EC tablet Take 325 mg by mouth daily.      Marland Kitchen atorvastatin (LIPITOR) 40 MG tablet Take 1 tablet (40 mg total) by mouth daily. 90 tablet 3  . Clorazepate Dipotassium (TRANXENE-SD) 11.25 MG TB24 Take 11.25 mg by mouth as needed. Take 1/2 tablet as needed      . Cyanocobalamin (B-12 PO) Take 1 tablet by mouth daily.    Marland Kitchen escitalopram (LEXAPRO) 20 MG tablet Take 1 tablet (20 mg total) by mouth daily. 90 tablet 3  . esomeprazole (NEXIUM) 40 MG capsule Take 1 capsule (40 mg total) by mouth daily before breakfast. 90 capsule 3  . memantine (NAMENDA) 10 MG tablet Take 1 tablet (10 mg total) by mouth 2 (two) times daily. 180 tablet 3  . Multiple Vitamin (MULTIVITAMIN) capsule Take 1 capsule by mouth daily.      . propranolol (INDERAL) 10 MG tablet Take 1 tablet (10 mg total) by mouth daily. Take one tablet by mouth at supper 90 tablet 1  . Tamsulosin HCl (FLOMAX) 0.4 MG CAPS Take 0.4 mg by mouth daily.      . nitroGLYCERIN (NITROSTAT) 0.4 MG SL tablet Place 1 tablet (0.4 mg total) under the tongue every 5 (five) minutes as needed. (Patient not taking: Reported on 05/17/2015) 25 tablet 8  . [DISCONTINUED] tadalafil (CIALIS) 10 MG tablet Take 10 mg by mouth daily as needed.       No current facility-administered medications on file prior to visit.    Past Medical History  Diagnosis Date  . Sleep apnea     noncompliant with CPAP  . Memory loss   . Heart murmur   . Syncope and collapse     No arrhythmia on loop recorder. Likely orthostatic hypotension  . Carotid artery disease ([place])     on the right side -60-79%  . Hyperlipidemia   . Coronary artery disease     prior bypass grafting. Most recent Myoview 2011 demonstrated no ischemia and normal left ventricular function    Past Surgical History  Procedure Laterality Date  . Coronary artery bypass graft  2006  . Cardiac  catheterization  2010    patent LIMA to LAD, SVG to D1, SVG to OM3, occluded SVG to a small D2. Normal EF  . Hand surgery    . Appendectomy      Social History   Social History  . Marital Status: Married    Spouse Name: N/A  . Number of Children: N/A  . Years of Education: N/A   Occupational History  . retired     [place] History Main Topics  . Smoking status: Never Smoker   . Smokeless tobacco: Never Used  . Alcohol Use: No  . Drug Use: No  . Sexual Activity: Not on file   Other Topics Concern  . Not on file   Social History Narrative    Family Status  Relation Status Death Age  . Mother Deceased     ? MI  . Father Deceased     kidney failure, CAD  . Sister Alive     13, alive and well  . Brother Deceased  3, melanoma, suicide, pneumonia  . Brother Alive     65, alive and well  . Child Alive     alive and well    ROS:  A complete 10 system ROS was obtained and was unremarkable except as above.   VITALS:   Filed Vitals:   05/17/15 1046  BP: 134/80  Pulse: 57  Height: 5\' 8"  (1.727 m)  Weight: 177 lb (80.287 kg)   Wt Readings from Last 3 Encounters:  05/17/15 177 lb (80.287 kg)  12/06/14 177 lb (80.287 kg)  05/18/14 177 lb (80.287 kg)     HEENT:  Normocephalic, atraumatic. The mucous membranes are moist. The superficial temporal arteries are without ropiness or tenderness. Cardiovascular: Regular rate and rhythm. Lungs: Clear to auscultation bilaterally. Neck: There are no carotid bruits noted bilaterally.  NEUROLOGICAL:  Orientation:  The patient is alert and oriented to person and place today.  He does look to his wife for finer aspects.  MMSE on 05/18/14 was actually improved at 21/30.    A complete MMSE was performed last year and he scored a 19/30 and the year prior he scored 17/30 Cranial nerves: There is good facial symmetry. Speech is fluent and clear. Soft palate rises symmetrically and there is no tongue deviation. Hearing is  intact to conversational tone. Tone: Tone is good throughout. Sensation: Sensation is intact to light touch throughout Coordination:  The patient has no difficulty with RAM's or FNF bilaterally. Motor: Strength is 5/5 in the bilateral upper and lower extremities. There is no pronator drift.  There are no fasciculations noted. Gait and Station: The patient arises easily out of the chair and walks well down the hall. Frontal release signs: The patient has no palmomental sign or glabellar tap.  Lab Results  Component Value Date   VITAMINB12 487 05/30/2013      Imp/plan: 1.  Dementia, likely of the Alzheimers type.  I spent greater than 50% of this 25 minute visit in counseling with the patient and his wife.  We talked about the diagnosis.  We talked about safety and medical treatments.  -The patient is no longer driving.  He has 24-hour per day care.  He is staying active.  He is no longer having much behavioral outbursts.  -He is doing well on Namenda, 10 mg twice a day.   -I recommended daily physical and mental exercises/learning new skills. 2.  Obstructive sleep apnea syndrome, noncompliant with CPAP.  -They understand morbidity and mortality associated with untreated sleep apnea. 3.  Possible B12 deficiency.  -He is on oral supplements  4.  Carotid stenosis  -Has a carotid ultrasound scheduled for later today. 5.  F/u 1 year

## 2015-05-17 NOTE — Patient Instructions (Signed)
Medication Instructions: Continue same medications.   Labwork: None.   Procedures/Testing: None.   Follow-Up: 1 year follow up with Dr. Arida.   Any Additional Special Instructions Will Be Listed Below (If Applicable).     If you need a refill on your cardiac medications before your next appointment, please call your pharmacy.   

## 2015-05-17 NOTE — Progress Notes (Signed)
Cardiology Office Note   Date:  05/17/2015   ID:  [name], DOB Jul 03, 1940, MRN [id number]  PCP:  Gilford Rile, MD  Cardiologist:   Kathlyn Sacramento, MD   Chief Complaint  Patient presents with  . other    12 month follow up. Meds reviewed by the patient verbally. "doing well."       History of Present Illness: LADAMIAN [name] is a 75 y.o. male who presents for a followup visit. He has known history of coronary artery disease status post coronary artery bypass graft surgery in 2006. He also has moderate asymptomatic right carotid stenosis as well as recurrent syncope without documented arrhythmia on loop recorder. He has known history of postprandial palpitations due to frequent PVCs and ventricular bigeminy. This was treated with propranolol.   The patient has been doing very well overall without reported chest pain or dyspnea. He has not had any syncope or presyncope. He does have dementia but this has been stable with no worsening over the last year. He continues to be functional.     Past Medical History  Diagnosis Date  . Sleep apnea     noncompliant with CPAP  . Memory loss   . Heart murmur   . Syncope and collapse     No arrhythmia on loop recorder. Likely orthostatic hypotension  . Carotid artery disease ([place])     on the right side -60-79%  . Hyperlipidemia   . Coronary artery disease     prior bypass grafting. Most recent Myoview 2011 demonstrated no ischemia and normal left ventricular function    Past Surgical History  Procedure Laterality Date  . Coronary artery bypass graft  2006  . Cardiac catheterization  2010    patent LIMA to LAD, SVG to D1, SVG to OM3, occluded SVG to a small D2. Normal EF  . Hand surgery    . Appendectomy       Current Outpatient Prescriptions  Medication Sig Dispense Refill  . aspirin 325 MG EC tablet Take 325 mg by mouth daily.      Marland Kitchen atorvastatin (LIPITOR) 40 MG tablet Take 1 tablet (40 mg total) by mouth daily. 90 tablet  3  . Clorazepate Dipotassium (TRANXENE-SD) 11.25 MG TB24 Take 11.25 mg by mouth as needed. Take 1/2 tablet as needed      . Cyanocobalamin (B-12 PO) Take 1 tablet by mouth daily.    Marland Kitchen escitalopram (LEXAPRO) 20 MG tablet Take 1 tablet (20 mg total) by mouth daily. 90 tablet 3  . esomeprazole (NEXIUM) 40 MG capsule Take 1 capsule (40 mg total) by mouth daily before breakfast. 90 capsule 3  . memantine (NAMENDA) 10 MG tablet Take 1 tablet (10 mg total) by mouth 2 (two) times daily. 180 tablet 3  . Multiple Vitamin (MULTIVITAMIN) capsule Take 1 capsule by mouth daily.      . nitroGLYCERIN (NITROSTAT) 0.4 MG SL tablet Place 1 tablet (0.4 mg total) under the tongue every 5 (five) minutes as needed. 25 tablet 8  . propranolol (INDERAL) 10 MG tablet Take 1 tablet (10 mg total) by mouth daily. Take one tablet by mouth at supper 90 tablet 1  . Tamsulosin HCl (FLOMAX) 0.4 MG CAPS Take 0.4 mg by mouth daily.      . [DISCONTINUED] tadalafil (CIALIS) 10 MG tablet Take 10 mg by mouth daily as needed.       No current facility-administered medications for this visit.    Allergies:   Review  of patient's allergies indicates no known allergies.    Social History:  The patient  reports that he has never smoked. He has never used smokeless tobacco. He reports that he does not drink alcohol or use illicit drugs.   Family History:  The patient's Family history is unknown by patient.    ROS:  Please see the history of present illness.   Otherwise, review of systems are positive for none.   All other systems are reviewed and negative.    PHYSICAL EXAM: VS:  BP 120/60 mmHg  Pulse 63  Ht 5\' 8"  (1.727 m)  Wt 178 lb 8 oz (80.967 kg)  BMI 27.15 kg/m2 , BMI Body mass index is 27.15 kg/(m^2). GEN: Well nourished, well developed, in no acute distress HEENT: normal Neck: no JVD, right carotid bruit, or masses Cardiac: RRR; no murmurs, rubs, or gallops,no edema  Respiratory:  clear to auscultation bilaterally,  normal work of breathing GI: soft, nontender, nondistended, + BS MS: no deformity or atrophy Skin: warm and dry, no rash Neuro:  Strength and sensation are intact Psych: euthymic mood, full affect   EKG:  EKG is ordered today. The ekg ordered today demonstrates : Normal sinus rhythm, left ventricular hypertrophy with repolarization abnormalities very prominent in the anterior and anterolateral leads. These changes are unchanged.   Recent Labs: No results found for requested labs within last 365 days.    Lipid Panel    Component Value Date/Time   CHOL 129 05/16/2013 1123   TRIG 37.0 05/16/2013 1123   HDL 54.60 05/16/2013 1123   CHOLHDL 2 05/16/2013 1123   VLDL 7.4 05/16/2013 1123   LDLCALC 67 05/16/2013 1123      Wt Readings from Last 3 Encounters:  05/17/15 178 lb 8 oz (80.967 kg)  05/17/15 177 lb (80.287 kg)  12/06/14 177 lb (80.287 kg)      Other studies Reviewed: Additional studies/ records that were reviewed today include: Office note with neurologist Dr .Carles Collet.    ASSESSMENT AND PLAN:  1.  Coronary artery disease involving bypass grafts without angina:  Most recent cardiac catheterization in 2010 showed occluded SVG to small second diagonal but all other grafts were patent. He continues to do well with no symptoms suggestive of angina. Continue medical therapy.  2. Moderate right carotid artery stenosis: Most recently, this was 60-79%. Repeat carotid Doppler today. Continue treatment with aspirin and a statin.  3. Hyperlipidemia: This is followed by his primary care physician. Continue treatment with atorvastatin with a target LDL of less than 70.  4. History of symptomatic PVCs: These are well-controlled on small dose propranolol.      Disposition:   FU with me in 1 year  Signed, Kathlyn Sacramento, MD  05/17/2015 3:13 PM    [place]

## 2015-05-29 ENCOUNTER — Other Ambulatory Visit: Payer: Self-pay

## 2015-05-29 MED ORDER — PROPRANOLOL HCL 10 MG PO TABS: 10.0000 mg | ORAL_TABLET | Freq: Every day | ORAL | Status: DC

## 2015-05-29 MED FILL — PROPRANOLOL 10 MG TABLET: 10 | 90 days supply | Qty: 90 | Fill #0

## 2015-05-31 DIAGNOSIS — H26492 Other secondary cataract, left eye: Secondary | ICD-10-CM | POA: Diagnosis not present

## 2015-06-04 DIAGNOSIS — Z79899 Other long term (current) drug therapy: Secondary | ICD-10-CM | POA: Insufficient documentation

## 2015-06-04 DIAGNOSIS — R5383 Other fatigue: Secondary | ICD-10-CM | POA: Insufficient documentation

## 2015-06-04 DIAGNOSIS — I6529 Occlusion and stenosis of unspecified carotid artery: Secondary | ICD-10-CM | POA: Insufficient documentation

## 2015-06-04 DIAGNOSIS — I471 Supraventricular tachycardia: Secondary | ICD-10-CM | POA: Insufficient documentation

## 2015-06-04 DIAGNOSIS — G4733 Obstructive sleep apnea (adult) (pediatric): Secondary | ICD-10-CM | POA: Insufficient documentation

## 2015-06-04 DIAGNOSIS — N400 Enlarged prostate without lower urinary tract symptoms: Secondary | ICD-10-CM | POA: Insufficient documentation

## 2015-06-04 DIAGNOSIS — R5381 Other malaise: Secondary | ICD-10-CM | POA: Insufficient documentation

## 2015-06-04 DIAGNOSIS — F341 Dysthymic disorder: Secondary | ICD-10-CM | POA: Insufficient documentation

## 2015-06-04 DIAGNOSIS — E782 Mixed hyperlipidemia: Secondary | ICD-10-CM | POA: Insufficient documentation

## 2015-06-07 DIAGNOSIS — Z79899 Other long term (current) drug therapy: Secondary | ICD-10-CM | POA: Diagnosis not present

## 2015-06-07 DIAGNOSIS — I471 Supraventricular tachycardia: Secondary | ICD-10-CM | POA: Diagnosis not present

## 2015-06-07 DIAGNOSIS — G4733 Obstructive sleep apnea (adult) (pediatric): Secondary | ICD-10-CM | POA: Diagnosis not present

## 2015-06-07 DIAGNOSIS — R5383 Other fatigue: Secondary | ICD-10-CM | POA: Diagnosis not present

## 2015-06-07 DIAGNOSIS — I6523 Occlusion and stenosis of bilateral carotid arteries: Secondary | ICD-10-CM | POA: Diagnosis not present

## 2015-06-07 DIAGNOSIS — G309 Alzheimer's disease, unspecified: Secondary | ICD-10-CM | POA: Diagnosis not present

## 2015-06-07 DIAGNOSIS — E782 Mixed hyperlipidemia: Secondary | ICD-10-CM | POA: Diagnosis not present

## 2015-06-07 DIAGNOSIS — R5381 Other malaise: Secondary | ICD-10-CM | POA: Diagnosis not present

## 2015-06-07 DIAGNOSIS — I10 Essential (primary) hypertension: Secondary | ICD-10-CM | POA: Diagnosis not present

## 2015-06-07 DIAGNOSIS — F341 Dysthymic disorder: Secondary | ICD-10-CM | POA: Diagnosis not present

## 2015-06-07 DIAGNOSIS — I2510 Atherosclerotic heart disease of native coronary artery without angina pectoris: Secondary | ICD-10-CM | POA: Diagnosis not present

## 2015-06-07 DIAGNOSIS — N400 Enlarged prostate without lower urinary tract symptoms: Secondary | ICD-10-CM | POA: Diagnosis not present

## 2015-06-25 DIAGNOSIS — Z951 Presence of aortocoronary bypass graft: Secondary | ICD-10-CM | POA: Diagnosis not present

## 2015-06-25 DIAGNOSIS — N400 Enlarged prostate without lower urinary tract symptoms: Secondary | ICD-10-CM | POA: Diagnosis not present

## 2015-06-25 DIAGNOSIS — M1990 Unspecified osteoarthritis, unspecified site: Secondary | ICD-10-CM | POA: Diagnosis not present

## 2015-06-25 DIAGNOSIS — E785 Hyperlipidemia, unspecified: Secondary | ICD-10-CM | POA: Diagnosis not present

## 2015-06-25 DIAGNOSIS — J45909 Unspecified asthma, uncomplicated: Secondary | ICD-10-CM | POA: Diagnosis not present

## 2015-06-25 DIAGNOSIS — I10 Essential (primary) hypertension: Secondary | ICD-10-CM | POA: Diagnosis not present

## 2015-06-25 DIAGNOSIS — F329 Major depressive disorder, single episode, unspecified: Secondary | ICD-10-CM | POA: Diagnosis not present

## 2015-06-25 DIAGNOSIS — G4733 Obstructive sleep apnea (adult) (pediatric): Secondary | ICD-10-CM | POA: Diagnosis not present

## 2015-06-25 DIAGNOSIS — Z79899 Other long term (current) drug therapy: Secondary | ICD-10-CM | POA: Diagnosis not present

## 2015-06-25 DIAGNOSIS — I2510 Atherosclerotic heart disease of native coronary artery without angina pectoris: Secondary | ICD-10-CM | POA: Diagnosis not present

## 2015-06-25 DIAGNOSIS — G309 Alzheimer's disease, unspecified: Secondary | ICD-10-CM | POA: Diagnosis not present

## 2015-06-25 DIAGNOSIS — H25811 Combined forms of age-related cataract, right eye: Secondary | ICD-10-CM | POA: Diagnosis not present

## 2015-06-25 DIAGNOSIS — F0280 Dementia in other diseases classified elsewhere without behavioral disturbance: Secondary | ICD-10-CM | POA: Diagnosis not present

## 2015-07-12 MED FILL — ESCITALOPRAM 20 MG TABLET: 20 | 90 days supply | Qty: 90 | Fill #1

## 2015-08-16 MED FILL — ATORVASTATIN 40 MG TABLET: 40 | 30 days supply | Qty: 30 | Fill #0

## 2015-08-16 MED FILL — TAMSULOSIN HCL 0.4 MG CAP: 0.4 | 30 days supply | Qty: 30 | Fill #0

## 2015-08-20 MED FILL — ESOMEPRAZOLE MAG DR 40 MG C: 40 | 90 days supply | Qty: 90 | Fill #1

## 2015-09-09 DIAGNOSIS — H5213 Myopia, bilateral: Secondary | ICD-10-CM | POA: Diagnosis not present

## 2015-09-23 MED FILL — PROPRANOLOL 10 MG TABLET: 10 | 90 days supply | Qty: 90 | Fill #1

## 2015-09-23 MED FILL — ATORVASTATIN 40 MG TABLET: 40 | 90 days supply | Qty: 90 | Fill #1

## 2015-10-02 DIAGNOSIS — B009 Herpesviral infection, unspecified: Secondary | ICD-10-CM | POA: Diagnosis not present

## 2015-10-02 DIAGNOSIS — L570 Actinic keratosis: Secondary | ICD-10-CM | POA: Diagnosis not present

## 2015-10-02 DIAGNOSIS — L578 Other skin changes due to chronic exposure to nonionizing radiation: Secondary | ICD-10-CM | POA: Diagnosis not present

## 2015-10-02 DIAGNOSIS — L821 Other seborrheic keratosis: Secondary | ICD-10-CM | POA: Diagnosis not present

## 2015-10-04 ENCOUNTER — Telehealth: Payer: Self-pay | Admitting: Neurology

## 2015-10-04 MED ORDER — MEMANTINE HCL 10 MG PO TABS: 10.0000 mg | ORAL_TABLET | Freq: Two times a day (BID) | ORAL | Status: DC

## 2015-10-04 MED FILL — MEMANTINE HCL 10 MG TABLET: 10 | 90 days supply | Qty: 180 | Fill #0

## 2015-10-04 NOTE — Telephone Encounter (Signed)
Namenda refill requested. Per last office note- patient to remain on medication. Refill approved and sent to patient's pharmacy.   

## 2015-10-04 NOTE — Telephone Encounter (Signed)
PT called and needs the prescription Namenda refilled out [facility] outpatient pharmacy/[place] [phone]

## 2015-11-19 ENCOUNTER — Encounter: Payer: Self-pay | Admitting: Cardiovascular Disease

## 2015-11-19 DIAGNOSIS — I471 Supraventricular tachycardia: Secondary | ICD-10-CM | POA: Diagnosis not present

## 2015-11-19 DIAGNOSIS — I2510 Atherosclerotic heart disease of native coronary artery without angina pectoris: Secondary | ICD-10-CM | POA: Diagnosis not present

## 2015-11-19 DIAGNOSIS — E782 Mixed hyperlipidemia: Secondary | ICD-10-CM | POA: Diagnosis not present

## 2015-11-19 DIAGNOSIS — G309 Alzheimer's disease, unspecified: Secondary | ICD-10-CM | POA: Diagnosis not present

## 2015-11-19 DIAGNOSIS — I10 Essential (primary) hypertension: Secondary | ICD-10-CM | POA: Diagnosis not present

## 2015-11-19 DIAGNOSIS — R5381 Other malaise: Secondary | ICD-10-CM | POA: Diagnosis not present

## 2015-11-19 DIAGNOSIS — R5383 Other fatigue: Secondary | ICD-10-CM | POA: Diagnosis not present

## 2015-11-19 DIAGNOSIS — Z79899 Other long term (current) drug therapy: Secondary | ICD-10-CM | POA: Diagnosis not present

## 2015-11-19 DIAGNOSIS — F341 Dysthymic disorder: Secondary | ICD-10-CM | POA: Diagnosis not present

## 2015-11-19 DIAGNOSIS — G4733 Obstructive sleep apnea (adult) (pediatric): Secondary | ICD-10-CM | POA: Diagnosis not present

## 2015-11-28 MED FILL — ESCITALOPRAM 20 MG TABLET: 20 | 90 days supply | Qty: 90 | Fill #0

## 2015-11-28 MED FILL — ESOMEPRAZOLE MAG DR 40 MG C: 40 | 90 days supply | Qty: 90 | Fill #0

## 2015-11-28 MED FILL — TAMSULOSIN HCL 0.4 MG CAP: 0.4 | 90 days supply | Qty: 90 | Fill #0

## 2015-12-20 DIAGNOSIS — Z23 Encounter for immunization: Secondary | ICD-10-CM | POA: Diagnosis not present

## 2016-02-10 ENCOUNTER — Other Ambulatory Visit: Payer: Self-pay | Admitting: *Deleted

## 2016-02-10 MED ORDER — ATORVASTATIN CALCIUM 40 MG PO TABS: 40.0000 mg | ORAL_TABLET | Freq: Every day | ORAL | 3 refills | Status: DC

## 2016-02-10 MED FILL — PROPRANOLOL 10 MG TABLET: 10 | 90 days supply | Qty: 90 | Fill #2

## 2016-02-10 MED FILL — ATORVASTATIN 40 MG TABLET: 40 | 90 days supply | Qty: 90 | Fill #0

## 2016-02-18 MED FILL — ESCITALOPRAM 20 MG TABLET: 20 | 90 days supply | Qty: 90 | Fill #1

## 2016-02-18 MED FILL — TAMSULOSIN HCL 0.4 MG CAP: 0.4 | 90 days supply | Qty: 90 | Fill #1

## 2016-02-18 MED FILL — ESOMEPRAZOLE MAG DR 40 MG C: 40 | 90 days supply | Qty: 90 | Fill #1

## 2016-04-06 DIAGNOSIS — Z8669 Personal history of other diseases of the nervous system and sense organs: Secondary | ICD-10-CM | POA: Diagnosis not present

## 2016-04-14 ENCOUNTER — Other Ambulatory Visit: Payer: Self-pay | Admitting: *Deleted

## 2016-04-14 MED ORDER — NITROGLYCERIN 0.4 MG SL SUBL: 0.4000 mg | SUBLINGUAL_TABLET | SUBLINGUAL | 1 refills | Status: DC | PRN

## 2016-04-14 MED FILL — NITROGLYCERIN 0.4 MG TAB SL: 0.4 | 5 days supply | Qty: 25 | Fill #0

## 2016-04-17 ENCOUNTER — Encounter: Payer: Self-pay | Admitting: Neurology

## 2016-05-15 NOTE — Progress Notes (Signed)
The patient is seen in neurologic follow up regarding AD.  This patient is accompanied in the office by his spouse who supplements the history.   The patient is a 76 y.o. year cancel male who has had memory issues for about 7 years, since hisCABG in 2006.  The patient does not do the finances in the home.  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  The patient does drive. He is able to get to familiar places but he would not be able to get to unfamiliar places with directions.  The farthest he drives is 15 min from home.   There have not been any motor vehicle accidents in the recent years.  He has refused OT driving eval and it has therefore been our recommendation that he quit driving.  The patient does not cook.  They generally eat out.      The patient is  able to perform his own ADL's.  The patient is not able to distribute his own medications.  His wife has done his pill box preparation for at least 11 years, which is how long they have been married.  She thinks that he would not know to take medications without her.  The patients bladder and bowel are  under good control.  There have been some behavioral changes over the years.  The initatiation of Lexapro has helped the easy agitation. There have been no hallucinations.  He always recognizes his family.  He had no difficulty recognizing family over the holidays.  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  His wife was and is very upset about this.  The large majority of the day the patient is with his grandson, but his wife also states that his grandson would not monitor something like this sale.  10/21/12 update:  The patient has Alzheimer's dementia and is currently on Razadyne, 16 mg daily.  His wife did call me and asked if this was the cause of his weight loss.  I told her that, although it could be, I doubted it somewhat  but told her that she could go ahead and hold it.  He has been off of it since the end of June.  He also saw a gastroenterologist.  A CT of the abdomen and pelvis was performed.  There was rectosigmoid junction and wall thickening, but it was felt that it was due to bowel distention.  Nonetheless, the patient is to undergo a colonoscopy.  He reported that he also had a B12 level done by his gastroenterologist which was low.  I do not have a copy of that.  He has been drinking boost and his wife thinks that perhaps this is why his weight is now going up.  However, they both admit that he really does not eat well.  He does very little protein, choosing to eat Raisin Bran much of the time.  He is still driving, although minimally.  05/30/13 update:  Pt is accompanied by his wife who supplements the history.  Not seen him since august, 2014.  Pt remains on razadyne 16 mg daily.  Wife distributes medications but she always has.  Tried without the medication and then only on 8 mg and stomach issues didn't change.   Is still on lexapro for agitation.  Doing well in that regard.  Can get easily frustrated and will lock himself in a car and sit there until he is better.    His wife states that keeping the same routine is key.  The pts grandson is with the pt all day and then the grandson meets his wife and drops him off.  Drives to his grandsons less than 1/2 mile up the road.  Otherwise, no driving.  05/18/14 update:  The patient is accompanied by his wife, who supplements the history.  Patients wife thinks that his memory continues to deteriorate.  She thinks that he is having intermittent rare behavioral issues.  For example, she took him to the bank and he wanted money out that they didn't have.  He got out of the car and started walking down a busy highway and wouldn't get back in the car.  She had to call the police and her son to come redirect him.  He didn't remember the incident the following day and couldn't  remember why he had a blister on the foot (from walking down the highway).   He was on the galantamine 16 mg but he is back off of it because he had nausea.  He has been off of it for at least 6 months.   He still takes care of ADL's.  His grandson stays with him during the day and they work on the farm together and then his grandson takes him to meet his wife after work.   He is on oral B12 and last year his B12 level was 487.  Cardiology is following him for carotid stenosis and he did have an ultrasound in January, 2016 that I reviewed.  Ventricle carotid artery stenosis was stable at 60-79% stenosis and left internal carotid artery stenosis was improved at 1-39% stenosis.  05/17/15 update:  The patient is accompanied by his wife, who supplements the history. Pt restarted namenda started since last visit.  He is doing much better than last visit.  Less irritated.  Wife thinks that is a combination of many things.  Some of that is the patient just seems less irritated.  Some of that is that she is learning to deal with it better.  In addition, they are no longer trying to keep the diagnosis to themselves and the community seems to be helping and pulling together to watch out for the patient.  His grandson stays with him during the day.  The patient does not cook for himself.  His wife administers his Namenda in the morning and then gives him the remainder of his pills before bedtime.  He does not drive.  No hallucinations.  The patient has a carotid ultrasound scheduled later this afternoon.  05/16/16 update:  Patient follows up today, accompanied by his wife who supplements the history.  Patient has a history of Alzheimer's dementia.  Patient is on Namenda, 10 mg twice per day.  Patients memory continues to slowly decline, but he continues to remain quite active physically with his grandson.  He is less active this time of year.  He is not left alone.  Temperament is better than it was in the past.   Generally  sleeps well.  Did fall out of the bed a few nights ago but that is unusual.  Saw cardiology today.  BP was elevated here and at cardiology.  Has PCP appt coming up.    Previous medications:  Aricept (cramps in hands)  Exelon patch (personality change)                                       Namenda (d/c secondary to "no help")                                       Lexapro helps agitation       razadyne (GI upset)  The patient has OSA but is noncompliant with CPAP.  He snores on his back.  He quit using it about 1 1/2 years ago.    Allergies  Allergen Reactions  . Captopril Other (See Comments)    unknown  . Methyldopa Other (See Comments)    unknown  . Metoprolol Other (See Comments)    unknown    Current Outpatient Prescriptions on File Prior to Visit  Medication Sig Dispense Refill  . aspirin 325 MG EC tablet Take 325 mg by mouth daily.      Marland Kitchen atorvastatin (LIPITOR) 40 MG tablet Take 1 tablet (40 mg total) by mouth daily. 90 tablet 3  . Clorazepate Dipotassium (TRANXENE-SD) 11.25 MG TB24 Take 11.25 mg by mouth as needed. Take 1/2 tablet as needed      . Cyanocobalamin (B-12 PO) Take 1 tablet by mouth daily.    Marland Kitchen escitalopram (LEXAPRO) 20 MG tablet Take 1 tablet (20 mg total) by mouth daily. 90 tablet 3  . esomeprazole (NEXIUM) 40 MG capsule Take 1 capsule (40 mg total) by mouth daily before breakfast. 90 capsule 3  . memantine (NAMENDA) 10 MG tablet Take 1 tablet (10 mg total) by mouth 2 (two) times daily. 180 tablet 3  . Multiple Vitamin (MULTIVITAMIN) capsule Take 1 capsule by mouth daily.      . nitroGLYCERIN (NITROSTAT) 0.4 MG SL tablet Place 1 tablet (0.4 mg total) under the tongue every 5 (five) minutes as needed. 25 tablet 1  . propranolol (INDERAL) 10 MG tablet Take 1 tablet (10 mg total) by mouth daily. Take one tablet by mouth at supper 90 tablet 3  . Tamsulosin HCl (FLOMAX) 0.4 MG CAPS Take 0.4 mg by mouth daily.      . [DISCONTINUED]  tadalafil (CIALIS) 10 MG tablet Take 10 mg by mouth daily as needed.       No current facility-administered medications on file prior to visit.     Past Medical History:  Diagnosis Date  . Carotid artery disease ([place])    on the right side -60-79%  . Coronary artery disease    prior bypass grafting. Most recent Myoview 2011 demonstrated no ischemia and normal left ventricular function  . Heart murmur   . Hyperlipidemia   . Memory loss   . Sleep apnea    noncompliant with CPAP  . Syncope and collapse    No arrhythmia on loop recorder. Likely orthostatic hypotension    Past Surgical History:  Procedure Laterality Date  . APPENDECTOMY    . CARDIAC CATHETERIZATION  2010   patent LIMA to LAD, SVG to D1, SVG to OM3, occluded SVG to a small D2. Normal EF  . CORONARY ARTERY BYPASS GRAFT  2006  . HAND SURGERY      Social History   Social History  . Marital status: Married    Spouse name: N/A  . Number of children: N/A  .  Years of education: N/A   Occupational History  . retired     [employer] [place] History Main Topics  . Smoking status: Never Smoker  . Smokeless tobacco: Never Used  . Alcohol use No  . Drug use: No  . Sexual activity: Not on file   Other Topics Concern  . Not on file   Social History Narrative  . No narrative on file    Family Status  Relation Status  . Mother Deceased   ? MI  . Father Deceased   kidney failure, CAD  . Sister Alive   83, alive and well  . Brother Deceased   44, melanoma, suicide, pneumonia  . Brother Alive   15, alive and well  . Child Alive   alive and well    ROS:  A complete 10 system ROS was obtained and was unremarkable except as above.   VITALS:   Vitals:   05/19/16 1405  BP: (!) 148/76  Pulse: 60  Temp: (!) 95 F (35 C)  SpO2: 95%  Weight: 176 lb (79.8 kg)  Height: 5\' 8"  (1.727 m)   Wt Readings from Last 3 Encounters:  05/19/16 176 lb (79.8 kg)  05/19/16 175 lb 6.4 oz (79.6 kg)  05/17/15 178 lb 8 oz (81  kg)     HEENT:  Normocephalic, atraumatic. The mucous membranes are moist. The superficial temporal arteries are without ropiness or tenderness. Cardiovascular: Regular rate and rhythm. Lungs: Clear to auscultation bilaterally. Neck: There are no carotid bruits noted bilaterally.  NEUROLOGICAL:  Orientation:  The patient is alert and oriented to person and place today.  He does look to his wife for finer aspects.  MMSE on 05/18/14 was actually improved at 21/30.    A complete MMSE was performed last year and he scored a 19/30 and the year prior he scored 17/30 Cranial nerves: There is good facial symmetry. Speech is fluent and clear. Soft palate rises symmetrically and there is no tongue deviation. Hearing is intact to conversational tone. Tone: Tone is good throughout. Sensation: Sensation is intact to light touch throughout Coordination:  The patient has no difficulty with RAM's or FNF bilaterally. Motor: Strength is 5/5 in the bilateral upper and lower extremities. There is no pronator drift.  There are no fasciculations noted. Gait and Station: The patient arises easily out of the chair and walks well down the hall. Frontal release signs: The patient has no palmomental sign or glabellar tap.  Lab Results  Component Value Date   VITAMINB12 487 05/30/2013      Imp/plan: 1.  Dementia, likely of the Alzheimers type.  I spent greater than 50% of this 25 minute visit in counseling with the patient and his wife.  We talked about the diagnosis.  We talked about safety and medical treatments.  -The patient is no longer driving.  He has 24-hour per day care.  He is staying active.  He is no longer having much behavioral outbursts.  -He is doing well on Namenda, 10 mg twice a day.   -Wife needs FMLA renewed but we haven't received it yet.   2.  Obstructive sleep apnea syndrome, noncompliant with CPAP.  -They understand morbidity and mortality associated with untreated sleep apnea. 3.   Possible B12 deficiency.  -He is on oral supplements  4.  Carotid stenosis  -Has a carotid ultrasound scheduled for later today. 5.  F/u 1 year.

## 2016-05-19 ENCOUNTER — Ambulatory Visit (INDEPENDENT_AMBULATORY_CARE_PROVIDER_SITE_OTHER): Payer: 59 | Admitting: Neurology

## 2016-05-19 ENCOUNTER — Encounter: Payer: Self-pay | Admitting: Cardiovascular Disease

## 2016-05-19 ENCOUNTER — Encounter: Payer: Self-pay | Admitting: Neurology

## 2016-05-19 ENCOUNTER — Ambulatory Visit (INDEPENDENT_AMBULATORY_CARE_PROVIDER_SITE_OTHER): Payer: 59 | Admitting: Cardiovascular Disease

## 2016-05-19 VITALS — BP 148/76 | HR 60 | Ht 68.0 in | Wt 176.0 lb

## 2016-05-19 VITALS — BP 150/70 | HR 58 | Ht 68.0 in | Wt 175.4 lb

## 2016-05-19 DIAGNOSIS — I25810 Atherosclerosis of coronary artery bypass graft(s) without angina pectoris: Secondary | ICD-10-CM | POA: Diagnosis not present

## 2016-05-19 DIAGNOSIS — G301 Alzheimer's disease with late onset: Secondary | ICD-10-CM

## 2016-05-19 DIAGNOSIS — I779 Disorder of arteries and arterioles, unspecified: Secondary | ICD-10-CM | POA: Diagnosis not present

## 2016-05-19 DIAGNOSIS — I10 Essential (primary) hypertension: Secondary | ICD-10-CM | POA: Diagnosis not present

## 2016-05-19 DIAGNOSIS — F0280 Dementia in other diseases classified elsewhere without behavioral disturbance: Secondary | ICD-10-CM

## 2016-05-19 DIAGNOSIS — I739 Peripheral vascular disease, unspecified: Secondary | ICD-10-CM

## 2016-05-19 DIAGNOSIS — E538 Deficiency of other specified B group vitamins: Secondary | ICD-10-CM

## 2016-05-19 DIAGNOSIS — E785 Hyperlipidemia, unspecified: Secondary | ICD-10-CM | POA: Diagnosis not present

## 2016-05-19 MED ORDER — MEMANTINE HCL 10 MG PO TABS: 10.0000 mg | ORAL_TABLET | Freq: Two times a day (BID) | ORAL | 3 refills | Status: DC

## 2016-05-19 MED FILL — MEMANTINE HCL 10 MG TABLET: 10 | 90 days supply | Qty: 180 | Fill #0

## 2016-05-19 NOTE — Patient Instructions (Signed)
Medication Instructions:  Your physician recommends that you continue on your current medications as directed. Please refer to the Current Medication list given to you today.  Labwork: No new orders.   Testing/Procedures: Your physician has requested that you have a carotid duplex at the [place] office. This test is an ultrasound of the carotid arteries in your neck. It looks at blood flow through these arteries that supply the brain with blood. Allow one hour for this exam. There are no restrictions or special instructions.  Follow-Up: Your physician wants you to follow-up in: 1 YEAR with Dr Fletcher Anon.  You will receive a reminder letter in the mail two months in advance. If you don't receive a letter, please call our office to schedule the follow-up appointment.   Any Other Special Instructions Will Be Listed Below (If Applicable).     If you need a refill on your cardiac medications before your next appointment, please call your pharmacy.

## 2016-05-19 NOTE — Progress Notes (Signed)
Cardiology Office Note   Date:  05/19/2016   ID:  [name], DOB [birth year], MRN [id number]  PCP:  Gilford Rile, MD  Cardiologist:   Kathlyn Sacramento, MD   Chief Complaint  Patient presents with  . Follow-up      History of Present Illness: [name] is a 76 y.o. male who presents for a followup visit. He has known history of coronary artery disease status post coronary artery bypass graft surgery in 2006. He also has moderate asymptomatic right carotid stenosis as well as recurrent syncope without documented arrhythmia on loop recorder. He has known history of postprandial palpitations due to frequent PVCs and ventricular bigeminy. This was treated with propranolol.   The patient has been doing very well overall without reported chest pain or dyspnea. He has not had any syncope or presyncope. He does have dementia but this has been stable with no worsening over the last year. He continues to be functional.  Over the last year, he used sublingual nitroglycerin only twice.   Past Medical History:  Diagnosis Date  . Carotid artery disease ([place])    on the right side -60-79%  . Coronary artery disease    prior bypass grafting. Most recent Myoview 2011 demonstrated no ischemia and normal left ventricular function  . Heart murmur   . Hyperlipidemia   . Memory loss   . Sleep apnea    noncompliant with CPAP  . Syncope and collapse    No arrhythmia on loop recorder. Likely orthostatic hypotension    Past Surgical History:  Procedure Laterality Date  . APPENDECTOMY    . CARDIAC CATHETERIZATION  2010   patent LIMA to LAD, SVG to D1, SVG to OM3, occluded SVG to a small D2. Normal EF  . CORONARY ARTERY BYPASS GRAFT  2006  . HAND SURGERY       Current Outpatient Prescriptions  Medication Sig Dispense Refill  . aspirin 325 MG EC tablet Take 325 mg by mouth daily.      Marland Kitchen atorvastatin (LIPITOR) 40 MG tablet Take 1 tablet (40 mg total) by mouth daily. 90 tablet 3  .  Clorazepate Dipotassium (TRANXENE-SD) 11.25 MG TB24 Take 11.25 mg by mouth as needed. Take 1/2 tablet as needed      . Cyanocobalamin (B-12 PO) Take 1 tablet by mouth daily.    Marland Kitchen escitalopram (LEXAPRO) 20 MG tablet Take 1 tablet (20 mg total) by mouth daily. 90 tablet 3  . esomeprazole (NEXIUM) 40 MG capsule Take 1 capsule (40 mg total) by mouth daily before breakfast. 90 capsule 3  . memantine (NAMENDA) 10 MG tablet Take 1 tablet (10 mg total) by mouth 2 (two) times daily. 180 tablet 3  . Multiple Vitamin (MULTIVITAMIN) capsule Take 1 capsule by mouth daily.      . nitroGLYCERIN (NITROSTAT) 0.4 MG SL tablet Place 1 tablet (0.4 mg total) under the tongue every 5 (five) minutes as needed. 25 tablet 1  . propranolol (INDERAL) 10 MG tablet Take 1 tablet (10 mg total) by mouth daily. Take one tablet by mouth at supper 90 tablet 3  . Tamsulosin HCl (FLOMAX) 0.4 MG CAPS Take 0.4 mg by mouth daily.       No current facility-administered medications for this visit.     Allergies:   Captopril; Methyldopa; and Metoprolol    Social History:  The patient  reports that he has never smoked. He has never used smokeless tobacco. He reports that he does not drink  alcohol or use drugs.   Family History:  The patient's Family history is unknown by patient.    ROS:  Please see the history of present illness.   Otherwise, review of systems are positive for none.   All other systems are reviewed and negative.    PHYSICAL EXAM: VS:  BP (!) 150/70   Pulse (!) 58   Ht 5\' 8"  (1.727 m)   Wt 175 lb 6.4 oz (79.6 kg)   BMI 26.67 kg/m  , BMI Body mass index is 26.67 kg/m. GEN: Well nourished, well developed, in no acute distress HEENT: normal Neck: no JVD, right carotid bruit, or masses Cardiac: RRR; no murmurs, rubs, or gallops,no edema  Respiratory:  clear to auscultation bilaterally, normal work of breathing GI: soft, nontender, nondistended, + BS MS: no deformity or atrophy Skin: warm and dry, no  rash Neuro:  Strength and sensation are intact Psych: euthymic mood, full affect   EKG:  EKG is ordered today. The ekg ordered today demonstrates : Normal sinus rhythm, left ventricular hypertrophy with repolarization abnormalities very prominent in the anterior and anterolateral leads. These changes are unchanged.   Recent Labs: No results found for requested labs within last 8760 hours.    Lipid Panel    Component Value Date/Time   CHOL 129 05/16/2013 1123   TRIG 37.0 05/16/2013 1123   HDL 54.60 05/16/2013 1123   CHOLHDL 2 05/16/2013 1123   VLDL 7.4 05/16/2013 1123   LDLCALC 67 05/16/2013 1123      Wt Readings from Last 3 Encounters:  05/19/16 175 lb 6.4 oz (79.6 kg)  05/17/15 178 lb 8 oz (81 kg)  05/17/15 177 lb (80.3 kg)      Other studies Reviewed: Additional studies/ records that were reviewed today include: Office note with neurologist Dr .Carles Collet.    ASSESSMENT AND PLAN:  1.  Coronary artery disease involving bypass grafts without angina:  Most recent cardiac catheterization in 2010 showed occluded SVG to small second diagonal but all other grafts were patent. He continues to do well with no symptoms suggestive of angina. Continue medical therapy.Most recent nuclear stress test in 2016 was low risk.  2. Moderate right carotid artery stenosis: Most recently, this was 60-79%. I recommend a follow-up carotid Doppler.. Continue treatment with aspirin and a statin.  3. Hyperlipidemia: This is followed by his primary care physician. Continue treatment with atorvastatin with a target LDL of less than 70.  4. History of symptomatic PVCs: These are well-controlled on small dose propranolol.  5. Essential hypertension: Blood pressure is mildly elevated today but that's somewhat unusual for him. Continue to monitor. If blood pressure is above 140, we can consider an ACE inhibitor or ARB. He is already bradycardic and thus it will be difficult to increase  propranolol.    Disposition:   FU with me in 1 year  Signed, Kathlyn Sacramento, MD  05/19/2016 11:32 AM    [place]

## 2016-05-22 DIAGNOSIS — E782 Mixed hyperlipidemia: Secondary | ICD-10-CM | POA: Diagnosis not present

## 2016-05-22 DIAGNOSIS — R5381 Other malaise: Secondary | ICD-10-CM | POA: Diagnosis not present

## 2016-05-22 DIAGNOSIS — F341 Dysthymic disorder: Secondary | ICD-10-CM | POA: Diagnosis not present

## 2016-05-22 DIAGNOSIS — G309 Alzheimer's disease, unspecified: Secondary | ICD-10-CM | POA: Diagnosis not present

## 2016-05-22 DIAGNOSIS — I10 Essential (primary) hypertension: Secondary | ICD-10-CM | POA: Diagnosis not present

## 2016-05-22 DIAGNOSIS — I2510 Atherosclerotic heart disease of native coronary artery without angina pectoris: Secondary | ICD-10-CM | POA: Diagnosis not present

## 2016-05-22 DIAGNOSIS — G4733 Obstructive sleep apnea (adult) (pediatric): Secondary | ICD-10-CM | POA: Diagnosis not present

## 2016-05-22 DIAGNOSIS — Z79899 Other long term (current) drug therapy: Secondary | ICD-10-CM | POA: Diagnosis not present

## 2016-05-22 DIAGNOSIS — Z125 Encounter for screening for malignant neoplasm of prostate: Secondary | ICD-10-CM | POA: Diagnosis not present

## 2016-05-22 DIAGNOSIS — I6523 Occlusion and stenosis of bilateral carotid arteries: Secondary | ICD-10-CM | POA: Diagnosis not present

## 2016-05-24 DIAGNOSIS — Z125 Encounter for screening for malignant neoplasm of prostate: Secondary | ICD-10-CM | POA: Insufficient documentation

## 2016-06-04 DIAGNOSIS — Z029 Encounter for administrative examinations, unspecified: Secondary | ICD-10-CM

## 2016-06-09 ENCOUNTER — Ambulatory Visit: Payer: Medicare Other | Admitting: Neurology

## 2016-06-09 ENCOUNTER — Ambulatory Visit: Payer: Medicare Other | Admitting: Cardiovascular Disease

## 2016-06-11 ENCOUNTER — Ambulatory Visit: Payer: Medicare Other | Admitting: Neurology

## 2016-06-26 ENCOUNTER — Ambulatory Visit: Payer: 59

## 2016-06-26 DIAGNOSIS — I779 Disorder of arteries and arterioles, unspecified: Secondary | ICD-10-CM

## 2016-06-26 DIAGNOSIS — I739 Peripheral vascular disease, unspecified: Secondary | ICD-10-CM

## 2016-06-26 DIAGNOSIS — I25810 Atherosclerosis of coronary artery bypass graft(s) without angina pectoris: Secondary | ICD-10-CM

## 2016-06-26 MED FILL — ATORVASTATIN 40 MG TABLET: 40 | 90 days supply | Qty: 90 | Fill #1

## 2016-06-28 LAB — VAS US CAROTID
LEFT ECA DIAS: -24 cm/s
LEFT VERTEBRAL DIAS: -19 cm/s
LICADDIAS: -25 cm/s
LICAPSYS: -58 cm/s
Left CCA dist dias: -20 cm/s
Left CCA dist sys: -108 cm/s
Left CCA prox dias: 8 cm/s
Left CCA prox sys: 85 cm/s
Left ICA dist sys: -86 cm/s
Left ICA prox dias: -13 cm/s
RCCADSYS: -108 cm/s
RCCAPDIAS: 7 cm/s
RCCAPSYS: 48 cm/s
RIGHT VERTEBRAL DIAS: -21 cm/s

## 2016-06-29 ENCOUNTER — Other Ambulatory Visit: Payer: Self-pay

## 2016-06-29 DIAGNOSIS — I739 Peripheral vascular disease, unspecified: Principal | ICD-10-CM

## 2016-06-29 DIAGNOSIS — I779 Disorder of arteries and arterioles, unspecified: Secondary | ICD-10-CM

## 2016-08-06 MED FILL — PROPRANOLOL 10 MG TABLET: 10 | 90 days supply | Qty: 90 | Fill #0

## 2016-08-06 MED FILL — ESOMEPRAZOLE MAG DR 40 MG C: 40 | 90 days supply | Qty: 90 | Fill #0

## 2016-09-08 MED FILL — ESCITALOPRAM 20 MG TABLET: 20 | 90 days supply | Qty: 90 | Fill #0

## 2016-10-01 DIAGNOSIS — L578 Other skin changes due to chronic exposure to nonionizing radiation: Secondary | ICD-10-CM | POA: Diagnosis not present

## 2016-10-01 DIAGNOSIS — L570 Actinic keratosis: Secondary | ICD-10-CM | POA: Diagnosis not present

## 2016-10-01 DIAGNOSIS — B351 Tinea unguium: Secondary | ICD-10-CM | POA: Diagnosis not present

## 2016-10-05 DIAGNOSIS — H35363 Drusen (degenerative) of macula, bilateral: Secondary | ICD-10-CM | POA: Diagnosis not present

## 2016-10-05 DIAGNOSIS — Z8669 Personal history of other diseases of the nervous system and sense organs: Secondary | ICD-10-CM | POA: Diagnosis not present

## 2016-11-03 DIAGNOSIS — S60351A Superficial foreign body of right thumb, initial encounter: Secondary | ICD-10-CM | POA: Diagnosis not present

## 2016-11-04 MED FILL — ESOMEPRAZOLE MAG DR 40 MG C: 40 | 90 days supply | Qty: 90 | Fill #1

## 2016-11-09 ENCOUNTER — Telehealth: Payer: Self-pay | Admitting: Neurology

## 2016-11-09 MED FILL — ATORVASTATIN 40 MG TABLET: 40 | 90 days supply | Qty: 90 | Fill #2

## 2016-11-09 MED FILL — MEMANTINE HCL 10 MG TABLET: 10 | 90 days supply | Qty: 180 | Fill #1

## 2016-11-09 NOTE — Telephone Encounter (Signed)
Patient called back and left a voicemail. She is asking if patient can have medication for behavior. She states he is having changes in behavior, getting agitated easily, and constantly moving around.  Please advise.

## 2016-11-09 NOTE — Telephone Encounter (Signed)
Left message on machine for patient to call back.

## 2016-11-09 NOTE — Telephone Encounter (Signed)
Lets try depakote - 250 mg bid.  Tell her to call in a month to let us know how doing

## 2016-11-09 NOTE — Telephone Encounter (Signed)
Patient wife would like to talk to someone about if it is time would to put him on some medication to help him, he is pacing the floors and rambling on about things

## 2016-11-10 MED ORDER — DIVALPROEX SODIUM 250 MG PO DR TAB: 250.0000 mg | DELAYED_RELEASE_TABLET | Freq: Two times a day (BID) | ORAL | 1 refills | Status: DC

## 2016-11-10 MED FILL — DIVALPROEX SOD DR 250 MG TA: 250 | 30 days supply | Qty: 60 | Fill #0

## 2016-11-10 NOTE — Telephone Encounter (Signed)
Patient's wife made aware.

## 2016-11-10 NOTE — Telephone Encounter (Signed)
RX sent to pharmacy. Left message for patient's wife to call me back.

## 2016-11-24 ENCOUNTER — Telehealth: Payer: Self-pay | Admitting: Neurology

## 2016-11-24 NOTE — Telephone Encounter (Signed)
Pt's wife called and was checking the status of the FMLA paperwork that was faxed for Dr Tat to fill out and sign

## 2016-11-24 NOTE — Telephone Encounter (Signed)
I have not seen any paperwork.  Do you know anything about this?

## 2016-11-24 NOTE — Telephone Encounter (Signed)
Dr. Carles Collet patient, I'll forward to her.

## 2016-11-25 NOTE — Telephone Encounter (Signed)
I'm not positive, but [name] may have already done this.  Can they wait until she is back and we can ask her?  If so, please put it in her inbox to ask her

## 2016-12-01 NOTE — Telephone Encounter (Signed)
Please check on this.

## 2016-12-01 NOTE — Telephone Encounter (Signed)
Patient's wife made aware we have not received any recently. Last FMLA received and completed in March 2018. She will talk to [employer].

## 2016-12-16 MED FILL — PROPRANOLOL 10 MG TABLET: 10 | 90 days supply | Qty: 90 | Fill #1

## 2016-12-16 MED FILL — ESCITALOPRAM 20 MG TABLET: 20 | 90 days supply | Qty: 90 | Fill #1

## 2016-12-16 MED FILL — NITROGLYCERIN 0.4 MG TAB SL: 0.4 | 5 days supply | Qty: 25 | Fill #1

## 2016-12-18 DIAGNOSIS — M79644 Pain in right finger(s): Secondary | ICD-10-CM | POA: Diagnosis not present

## 2016-12-18 DIAGNOSIS — M65311 Trigger thumb, right thumb: Secondary | ICD-10-CM | POA: Diagnosis not present

## 2017-02-03 MED FILL — ESOMEPRAZOLE MAG DR 40 MG C: 40 | 90 days supply | Qty: 90 | Fill #0

## 2017-02-19 DIAGNOSIS — N400 Enlarged prostate without lower urinary tract symptoms: Secondary | ICD-10-CM | POA: Diagnosis not present

## 2017-02-19 DIAGNOSIS — R5383 Other fatigue: Secondary | ICD-10-CM | POA: Diagnosis not present

## 2017-02-19 DIAGNOSIS — I10 Essential (primary) hypertension: Secondary | ICD-10-CM | POA: Diagnosis not present

## 2017-02-19 DIAGNOSIS — R5381 Other malaise: Secondary | ICD-10-CM | POA: Diagnosis not present

## 2017-02-19 DIAGNOSIS — I471 Supraventricular tachycardia: Secondary | ICD-10-CM | POA: Diagnosis not present

## 2017-02-19 DIAGNOSIS — Z23 Encounter for immunization: Secondary | ICD-10-CM | POA: Diagnosis not present

## 2017-02-19 DIAGNOSIS — Z0000 Encounter for general adult medical examination without abnormal findings: Secondary | ICD-10-CM | POA: Diagnosis not present

## 2017-02-19 DIAGNOSIS — I2510 Atherosclerotic heart disease of native coronary artery without angina pectoris: Secondary | ICD-10-CM | POA: Diagnosis not present

## 2017-02-19 DIAGNOSIS — Z79899 Other long term (current) drug therapy: Secondary | ICD-10-CM | POA: Diagnosis not present

## 2017-02-19 DIAGNOSIS — I6523 Occlusion and stenosis of bilateral carotid arteries: Secondary | ICD-10-CM | POA: Diagnosis not present

## 2017-02-19 DIAGNOSIS — G4733 Obstructive sleep apnea (adult) (pediatric): Secondary | ICD-10-CM | POA: Diagnosis not present

## 2017-02-19 DIAGNOSIS — Z125 Encounter for screening for malignant neoplasm of prostate: Secondary | ICD-10-CM | POA: Diagnosis not present

## 2017-02-19 DIAGNOSIS — G309 Alzheimer's disease, unspecified: Secondary | ICD-10-CM | POA: Diagnosis not present

## 2017-02-19 DIAGNOSIS — E782 Mixed hyperlipidemia: Secondary | ICD-10-CM | POA: Diagnosis not present

## 2017-02-25 ENCOUNTER — Telehealth: Payer: Self-pay | Admitting: Neurology

## 2017-02-25 NOTE — Telephone Encounter (Signed)
Message received from wife:  Hey, If I need to call in for [name] I will but I have a question.  His date of birth 11/02/1940.  I spoke to you several months back about something to kind of calm him down so he would be more restful in nature.  I can't remember the name of the medication, but it did not work, it made him wander even more.  I only gave it to him for 10 days and then stopped.  Do you have any other suggestions?  He starts about 1pm wanting to go to [place] to meet me at 5:30.  His son stays with him and drives him there, but he gets so angry with him and me if I am at home on the week ends.  He needs something to just sort of take the edge off.  He has Tranzene, but I have not tried it until I speak to Dr. Carles Collet.  If he needs to be seen before March just let me know.  Thanks   Will call tomorrow to address.

## 2017-02-26 NOTE — Telephone Encounter (Signed)
See me today about getting him in sooner.  I have a slot where I think that I can put him in.

## 2017-02-26 NOTE — Telephone Encounter (Signed)
Patient has tried the following medications in the past:   Aricept (cramps in hands)  Exelon patch (personality change)   Namenda (d/c secondary to "no help")    Lexapro helps agitation   razadyne (GI upset)  Having more confusion/trying to wander more. Please advise.

## 2017-02-26 NOTE — Telephone Encounter (Signed)
Appt scheduled with patient's wife.

## 2017-03-04 NOTE — Progress Notes (Signed)
The patient is seen in neurologic follow up regarding AD.  This patient is accompanied in the office by his spouse who supplements the history.   The patient is a 76 y.o. year cancel male who has had memory issues for about 7 years, since hisCABG in 2006.  The patient does not do the finances in the home.  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  The patient does drive. He is able to get to familiar places but he would not be able to get to unfamiliar places with directions.  The farthest he drives is 15 min from home.   There have not been any motor vehicle accidents in the recent years.  He has refused OT driving eval and it has therefore been our recommendation that he quit driving.  The patient does not cook.  They generally eat out.      The patient is  able to perform his own ADL's.  The patient is not able to distribute his own medications.  His wife has done his pill box preparation for at least 11 years, which is how long they have been married.  She thinks that he would not know to take medications without her.  The patients bladder and bowel are  under good control.  There have been some behavioral changes over the years.  The initatiation of Lexapro has helped the easy agitation. There have been no hallucinations.  He always recognizes his family.  He had no difficulty recognizing family over the holidays.  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  His wife was and is very upset about this.  The large majority of the day the patient is with his grandson, but his wife also states that his grandson would not monitor something like this sale.  10/21/12 update:  The patient has Alzheimer's dementia and is currently on Razadyne, 16 mg daily.  His wife did call me and asked if this was the cause of his weight loss.  I told her that, although it could be, I doubted it somewhat  but told her that she could go ahead and hold it.  He has been off of it since the end of June.  He also saw a gastroenterologist.  A CT of the abdomen and pelvis was performed.  There was rectosigmoid junction and wall thickening, but it was felt that it was due to bowel distention.  Nonetheless, the patient is to undergo a colonoscopy.  He reported that he also had a B12 level done by his gastroenterologist which was low.  I do not have a copy of that.  He has been drinking boost and his wife thinks that perhaps this is why his weight is now going up.  However, they both admit that he really does not eat well.  He does very little protein, choosing to eat Raisin Bran much of the time.  He is still driving, although minimally.  05/30/13 update:  Pt is accompanied by his wife who supplements the history.  Not seen him since august, 2014.  Pt remains on razadyne 16 mg daily.  Wife distributes medications but she always has.  Tried without the medication and then only on 8 mg and stomach issues didn't change.   Is still on lexapro for agitation.  Doing well in that regard.  Can get easily frustrated and will lock himself in a car and sit there until he is better.    His wife states that keeping the same routine is key.  The pts grandson is with the pt all day and then the grandson meets his wife and drops him off.  Drives to his grandsons less than 1/2 mile up the road.  Otherwise, no driving.  05/18/14 update:  The patient is accompanied by his wife, who supplements the history.  Patients wife thinks that his memory continues to deteriorate.  She thinks that he is having intermittent rare behavioral issues.  For example, she took him to the bank and he wanted money out that they didn't have.  He got out of the car and started walking down a busy highway and wouldn't get back in the car.  She had to call the police and her son to come redirect him.  He didn't remember the incident the following day and couldn't  remember why he had a blister on the foot (from walking down the highway).   He was on the galantamine 16 mg but he is back off of it because he had nausea.  He has been off of it for at least 6 months.   He still takes care of ADL's.  His grandson stays with him during the day and they work on the farm together and then his grandson takes him to meet his wife after work.   He is on oral B12 and last year his B12 level was 487.  Cardiology is following him for carotid stenosis and he did have an ultrasound in January, 2016 that I reviewed.  Ventricle carotid artery stenosis was stable at 60-79% stenosis and left internal carotid artery stenosis was improved at 1-39% stenosis.  05/17/15 update:  The patient is accompanied by his wife, who supplements the history. Pt restarted namenda started since last visit.  He is doing much better than last visit.  Less irritated.  Wife thinks that is a combination of many things.  Some of that is the patient just seems less irritated.  Some of that is that she is learning to deal with it better.  In addition, they are no longer trying to keep the diagnosis to themselves and the community seems to be helping and pulling together to watch out for the patient.  His grandson stays with him during the day.  The patient does not cook for himself.  His wife administers his Namenda in the morning and then gives him the remainder of his pills before bedtime.  He does not drive.  No hallucinations.  The patient has a carotid ultrasound scheduled later this afternoon.  05/16/16 update:  Patient follows up today, accompanied by his wife who supplements the history.  Patient has a history of Alzheimer's dementia.  Patient is on Namenda, 10 mg twice per day.  Patients memory continues to slowly decline, but he continues to remain quite active physically with his grandson.  He is less active this time of year.  He is not left alone.  Temperament is better than it was in the past.   Generally  sleeps well.  Did fall out of the bed a few nights ago but that is unusual.  Saw cardiology today.  BP was elevated here and at cardiology.  Has PCP appt coming up.    03/05/17 update: Patient is seen today for Alzheimer's dementia.  He is accompanied by his wife who supplements the  history.  He was worked in today.  Wife has been concerned about increasing agitation.  We tried Depakote since last visit.  He took it for about 10 days and I stopped it because it was not helpful.  Wife reports that patient becomes very anxious, restless and agitated at times.  They would like something to help "calm him down."  If his wife takes a day off, she states that she will stay in the bed as long as she can because he will immediately bother her and be ready to "go" all day long.  If they don't "go" he will pace and be up and down all day long.  Tried tranxene without relief.  He locked his son out of the house the other day.  Wife states that he is good as long as they are "going" but if they cannot "go" there is an issue.    Previous medications:  Aricept (cramps in hands)                                       Exelon patch (personality change)                                       Lexapro helps agitation       razadyne (GI upset)      VPA(tried x 10 days and d/c due to "no help")      tranxene  The patient has OSA but is noncompliant with CPAP.  He snores on his back.  He quit using it about 1 1/2 years ago.    Allergies  Allergen Reactions  . Captopril Other (See Comments)    unknown  . Methyldopa Other (See Comments)    unknown  . Metoprolol Other (See Comments)    unknown    Current Outpatient Medications on File Prior to Visit  Medication Sig Dispense Refill  . aspirin 325 MG EC tablet Take 325 mg by mouth daily.      Marland Kitchen atorvastatin (LIPITOR) 40 MG tablet Take 1 tablet (40 mg total) by mouth daily. 90 tablet 3  . Clorazepate Dipotassium (TRANXENE-SD) 11.25 MG TB24 Take 11.25 mg by mouth as  needed. Take 1/2 tablet as needed      . Cyanocobalamin (B-12 PO) Take 1 tablet by mouth daily.    . divalproex (DEPAKOTE) 250 MG DR tablet Take 1 tablet (250 mg total) by mouth 2 (two) times daily. 60 tablet 1  . escitalopram (LEXAPRO) 20 MG tablet Take 1 tablet (20 mg total) by mouth daily. 90 tablet 3  . esomeprazole (NEXIUM) 40 MG capsule Take 1 capsule (40 mg total) by mouth daily before breakfast. 90 capsule 3  . memantine (NAMENDA) 10 MG tablet Take 1 tablet (10 mg total) by mouth 2 (two) times daily. 180 tablet 3  . Multiple Vitamin (MULTIVITAMIN) capsule Take 1 capsule by mouth daily.      . propranolol (INDERAL) 10 MG tablet Take 1 tablet (10 mg total) by mouth daily. Take one tablet by mouth at supper 90 tablet 3  . Tamsulosin HCl (FLOMAX) 0.4 MG CAPS Take 0.4 mg by mouth daily.      . nitroGLYCERIN (NITROSTAT) 0.4 MG SL tablet Place 1 tablet (0.4 mg total) under the tongue every 5 (five) minutes as needed. (Patient not taking:  Reported on 03/05/2017) 25 tablet 1  . [DISCONTINUED] tadalafil (CIALIS) 10 MG tablet Take 10 mg by mouth daily as needed.       No current facility-administered medications on file prior to visit.     Past Medical History:  Diagnosis Date  . Carotid artery disease ([place])    on the right side -60-79%  . Coronary artery disease    prior bypass grafting. Most recent Myoview 2011 demonstrated no ischemia and normal left ventricular function  . Heart murmur   . Hyperlipidemia   . Memory loss   . Sleep apnea    noncompliant with CPAP  . Syncope and collapse    No arrhythmia on loop recorder. Likely orthostatic hypotension    Past Surgical History:  Procedure Laterality Date  . APPENDECTOMY    . CARDIAC CATHETERIZATION  2010   patent LIMA to LAD, SVG to D1, SVG to OM3, occluded SVG to a small D2. Normal EF  . CORONARY ARTERY BYPASS GRAFT  2006  . HAND SURGERY      Social History   Socioeconomic History  . Marital status: Married    Spouse name:  Not on file  . Number of children: Not on file  . Years of education: Not on file  . Highest education level: Not on file  Social Needs  . Financial resource strain: Not on file  . Food insecurity - worry: Not on file  . Food insecurity - inability: Not on file  . Transportation needs - medical: Not on file  . Transportation needs - non-medical: Not on file  Occupational History  . Occupation: retired    Comment: Psychologist, sport and exercise  Tobacco Use  . Smoking status: Never Smoker  . Smokeless tobacco: Never Used  Substance and Sexual Activity  . Alcohol use: No  . Drug use: No  . Sexual activity: Not on file  Other Topics Concern  . Not on file  Social History Narrative  . Not on file    Family Status  Relation Name Status  . Mother  Deceased       ? MI  . Father  Deceased       kidney failure, CAD  . Sister ##Sister1 Alive       77, alive and well  . Brother ##Brother1 Deceased       30, melanoma, suicide, pneumonia  . Brother ##Brother2 Alive       17, alive and well  . Child ##Child1 Alive       alive and well    ROS:  A complete 10 system ROS was obtained and was unremarkable except as above.   VITALS:   Vitals:   03/05/17 0821  BP: 130/74  Pulse: 62  SpO2: 94%  Weight: 174 lb (78.9 kg)  Height: 5\' 8"  (1.727 m)   Wt Readings from Last 3 Encounters:  03/05/17 174 lb (78.9 kg)  05/19/16 176 lb (79.8 kg)  05/19/16 175 lb 6.4 oz (79.6 kg)     HEENT:  Normocephalic, atraumatic. The mucous membranes are moist. The superficial temporal arteries are without ropiness or tenderness. Cardiovascular: Regular rate and rhythm. Lungs: Clear to auscultation bilaterally. Neck: There are no carotid bruits noted bilaterally.  NEUROLOGICAL:  Orientation:  The patient is alert and oriented to person and place today.   Wife has to help him with finer aspects of the hx Cranial nerves: There is good facial symmetry. Speech is fluent and clear. Soft palate rises symmetrically and there is  no tongue deviation. Hearing is intact to conversational tone. Tone: Tone is good throughout. Sensation: Sensation is intact to light touch throughout Coordination:  The patient has no difficulty with RAM's or FNF bilaterally. Motor: Strength is 5/5 in the bilateral upper and lower extremities. There is no pronator drift.  There are no fasciculations noted. Gait and Station: The patient arises easily out of the chair and walks well down the hall. Frontal release signs: The patient has no palmomental sign or glabellar tap.  Lab Results  Component Value Date   VITAMINB12 487 05/30/2013      Imp/plan: 1.  Dementia, likely of the Alzheimers type.    -The patient is no longer driving.  He has 24-hour per day care.    -Patient is having more agitation and Dia Crawford is a big issue.  He did not take Depakote for very long, but they did not think that it was beneficial.  We discussed the atypical antipsychotic medications.  We did talk about the fact that the atypical antipsychotic medications are not indicated for dementia related psychosis and increased risk of mortality in the elderly, usually because of infectious or  cardiac related. Understanding is expressed and they were agreeable that the benefits outweigh the risks in this case.  We also discussed benzo's.  I don't like them in this age group but they may be the only thing that prevents this Dia Crawford that has become so bothersome and debilitating for the patients wife.  After long discussion decided to try xanax, 0.25 mg, 1 tablet at 1 pm when restlessness starts.  Can take up to tid but suspect he will often need only qday.  Sleeping well at night.  Wife will let me know if affects cognition.  Risks, benefits, side effects and alternative therapies were discussed.  The opportunity to ask questions was given and they were answered to the best of my ability.  The patient expressed understanding and willingness to follow the outlined treatment  protocols.  -He is doing well on Namenda, 10 mg twice a day.    2.  Obstructive sleep apnea syndrome, noncompliant with CPAP.  -They understand morbidity and mortality associated with untreated sleep apnea. 3.  Possible B12 deficiency.  -He is on oral supplements  4.  Carotid stenosis  -following with yearly u/s 5.  Moved Jan appt for 4 months.  Will let me know if has issues prior to that.  Much greater than 50% of this visit was spent in counseling and coordinating care.  Total face to face time:  25 min

## 2017-03-05 ENCOUNTER — Ambulatory Visit (INDEPENDENT_AMBULATORY_CARE_PROVIDER_SITE_OTHER): Payer: 59 | Admitting: Neurology

## 2017-03-05 ENCOUNTER — Encounter: Payer: Self-pay | Admitting: Neurology

## 2017-03-05 VITALS — BP 130/74 | HR 62 | Ht 68.0 in | Wt 174.0 lb

## 2017-03-05 DIAGNOSIS — I25810 Atherosclerosis of coronary artery bypass graft(s) without angina pectoris: Secondary | ICD-10-CM

## 2017-03-05 DIAGNOSIS — F0281 Dementia in other diseases classified elsewhere with behavioral disturbance: Secondary | ICD-10-CM

## 2017-03-05 DIAGNOSIS — F02818 Dementia in other diseases classified elsewhere, unspecified severity, with other behavioral disturbance: Secondary | ICD-10-CM

## 2017-03-05 DIAGNOSIS — G301 Alzheimer's disease with late onset: Secondary | ICD-10-CM | POA: Diagnosis not present

## 2017-03-05 MED ORDER — ALPRAZOLAM 0.25 MG PO TABS: 0.2500 mg | ORAL_TABLET | Freq: Three times a day (TID) | ORAL | 1 refills | Status: DC | PRN

## 2017-03-11 ENCOUNTER — Other Ambulatory Visit: Payer: Self-pay | Admitting: Cardiovascular Disease

## 2017-03-11 MED ORDER — ATORVASTATIN CALCIUM 40 MG PO TABS: 40.0000 mg | ORAL_TABLET | Freq: Every day | ORAL | 0 refills | Status: DC

## 2017-03-11 MED ORDER — PROPRANOLOL HCL 10 MG PO TABS: 10.0000 mg | ORAL_TABLET | Freq: Every day | ORAL | 0 refills | Status: DC

## 2017-03-11 NOTE — Telephone Encounter (Signed)
Pt's medication was sent to pt's pharmacy as requested. Confirmation received.  °

## 2017-03-22 MED FILL — ALPRAZolam 0.25 MG TABS: 0.25 | 30 days supply | Qty: 90 | Fill #0

## 2017-03-22 MED FILL — ATORVASTATIN 40 MG TABLET: 40 | 90 days supply | Qty: 90 | Fill #0

## 2017-03-22 MED FILL — PROPRANOLOL HCL 10 MG TABS: 10 | 90 days supply | Qty: 90 | Fill #0

## 2017-04-14 ENCOUNTER — Ambulatory Visit: Payer: 59 | Admitting: Neurology

## 2017-04-15 ENCOUNTER — Telehealth: Payer: Self-pay | Admitting: Neurology

## 2017-04-15 MED ORDER — QUETIAPINE FUMARATE 25 MG PO TABS: 12.5000 mg | ORAL_TABLET | Freq: Two times a day (BID) | ORAL | 1 refills | Status: DC

## 2017-04-15 MED FILL — QUETIAPINE FUMARATE 25 MG T: 25 | 30 days supply | Qty: 30 | Fill #0

## 2017-04-15 NOTE — Telephone Encounter (Signed)
Received the following message from the patient's wife,  "Dr. Carles Collet started [name] on Xanax up to 3 x per day to try to settle him down.  I started giving it at mid day and saw that it was not helping him at all.  I then gave it to him in the morning before I left work and then again at noon and I have not seen any improvement.  What is another suggestion she might have for settling him down?  DOB 07/18/1940 [name].  Thanks so much for your help."  Dr. Carles Collet - please advise.

## 2017-04-15 NOTE — Telephone Encounter (Signed)
Spoke with patient's wife and she is willing to try Seroquel (has not been on this medication prior). RX sent to pharmacy. I will check on him in a couple weeks.

## 2017-04-15 NOTE — Telephone Encounter (Signed)
Has failed VPA and xanax.  Likely will need seroquel (we haven't tried it, have we?  I remember discussing black box warning with wife).  If agreeable, give 25 mg, 1/2 po bid and see how he does.  Call in 2 weeks

## 2017-04-30 ENCOUNTER — Telehealth: Payer: Self-pay | Admitting: Neurology

## 2017-04-30 NOTE — Telephone Encounter (Signed)
-----   Message from Annamaria Helling, [place] sent at 04/15/2017  3:23 PM EST ----- Check with wife to see how he is doing on seroquel

## 2017-04-30 NOTE — Telephone Encounter (Signed)
Spoke with patient's wife. She states patient is doing well on Seroquel. He is taking 25 mg - 1/2 tablet at lunch and 1/2 tablet at bedtime and it seems to be working. When she gave it in the morning it was making him groggy.   She will continue current dose and call with any issues. Dr. Carles ColletJuluis Rainier.

## 2017-05-04 ENCOUNTER — Telehealth: Payer: Self-pay | Admitting: Neurology

## 2017-05-04 NOTE — Telephone Encounter (Signed)
Patient's wife made aware.

## 2017-05-04 NOTE — Telephone Encounter (Signed)
Received the following message from patient's wife:   "Hey, I am writing about [name], dob 11/13/1940.  I talked to you on Friday and everything was going pretty well.  That has taken a turn.  On Saturday, he was just all over the place and yesterday he didn't think that I lived there, he wanted to take me home.   There was no explaining anything to him.  His son just texted me and he will not wait to I get off to come to [place] to meet me.  I have called him 3 times and he just does not understand what I am talking about.  He sleeps well at night but the days are killing [place].  I need HELP!!!! Thanks"  Please advise.

## 2017-05-04 NOTE — Telephone Encounter (Signed)
Hopefully , he has gotten used to it.  Increase seroquel to 1 po bid (I think that they give the first at lunch)

## 2017-05-11 MED FILL — ESOMEPRAZOLE MAG DR 40 MG C: 40 | 90 days supply | Qty: 90 | Fill #1

## 2017-05-11 MED FILL — MEMANTINE HCL 10 MG TABLET: 10 | 90 days supply | Qty: 180 | Fill #2

## 2017-05-11 MED FILL — TAMSULOSIN HCL 0.4 MG CAP: 0.4 | 90 days supply | Qty: 90 | Fill #0

## 2017-05-11 MED FILL — ESCITALOPRAM 20 MG TABLET: 20 | 90 days supply | Qty: 90 | Fill #0

## 2017-05-19 MED FILL — ALPRAZolam 0.25 MG TABS: 0.25 | 30 days supply | Qty: 90 | Fill #1

## 2017-05-21 ENCOUNTER — Encounter: Payer: Self-pay | Admitting: Cardiovascular Disease

## 2017-05-21 ENCOUNTER — Ambulatory Visit (INDEPENDENT_AMBULATORY_CARE_PROVIDER_SITE_OTHER): Payer: 59 | Admitting: Cardiovascular Disease

## 2017-05-21 ENCOUNTER — Ambulatory Visit (INDEPENDENT_AMBULATORY_CARE_PROVIDER_SITE_OTHER): Payer: 59

## 2017-05-21 VITALS — BP 122/60 | HR 75 | Ht 68.0 in | Wt 178.0 lb

## 2017-05-21 DIAGNOSIS — I779 Disorder of arteries and arterioles, unspecified: Secondary | ICD-10-CM

## 2017-05-21 DIAGNOSIS — E785 Hyperlipidemia, unspecified: Secondary | ICD-10-CM | POA: Diagnosis not present

## 2017-05-21 DIAGNOSIS — I2510 Atherosclerotic heart disease of native coronary artery without angina pectoris: Secondary | ICD-10-CM

## 2017-05-21 DIAGNOSIS — I739 Peripheral vascular disease, unspecified: Secondary | ICD-10-CM

## 2017-05-21 DIAGNOSIS — I10 Essential (primary) hypertension: Secondary | ICD-10-CM | POA: Diagnosis not present

## 2017-05-21 NOTE — Patient Instructions (Signed)
Medication Instructions:  Your physician recommends that you continue on your current medications as directed. Please refer to the Current Medication list given to you today.   Labwork: none  Testing/Procedures: Your physician has requested that you have a carotid duplex in one year. This test is an ultrasound of the carotid arteries in your neck. It looks at blood flow through these arteries that supply the brain with blood. Allow one hour for this exam. There are no restrictions or special instructions.    Follow-Up: Your physician wants you to follow-up in: 1 year with Dr. Fletcher Anon.  You will receive a reminder letter in the mail two months in advance. If you don't receive a letter, please call our office to schedule the follow-up appointment.  Carotid doppler and follow up appointment may be the same.   Any Other Special Instructions Will Be Listed Below (If Applicable).     If you need a refill on your cardiac medications before your next appointment, please call your pharmacy.

## 2017-05-21 NOTE — Progress Notes (Signed)
Cardiology Office Note   Date:  05/21/2017   ID:  GREELY [name], DOB [birth year], MRN [id number]  PCP:  [name]., MD  Cardiologist:   [name] Sacramento, MD   Chief Complaint  Patient presents with  . Other    12 month follow up. Patient denies chest pain and SOB. Patient states he is in better shape than what he was. Meds reviewed vebally with patient.       History of Present Illness: [name] is a 77 y.o. male who presents for a followup visit. He has known history of coronary artery disease status post coronary artery bypass graft surgery in 2006. He also has moderate asymptomatic right carotid stenosis as well as remote recurrent syncope without documented arrhythmia on loop recorder.  He has known history of PVCs treated successfully with propranolol.    He has been doing well overall with no recent chest pain, shortness of breath or palpitations.  He does have dementia but this has been stable with no worsening over the last year. He continues to be functional.    Past Medical History:  Diagnosis Date  . Carotid artery disease ([place])    on the right side -60-79%  . Coronary artery disease    prior bypass grafting. Most recent Myoview 2011 demonstrated no ischemia and normal left ventricular function  . Heart murmur   . Hyperlipidemia   . Memory loss   . Sleep apnea    noncompliant with CPAP  . Syncope and collapse    No arrhythmia on loop recorder. Likely orthostatic hypotension    Past Surgical History:  Procedure Laterality Date  . APPENDECTOMY    . CARDIAC CATHETERIZATION  2010   patent LIMA to LAD, SVG to D1, SVG to OM3, occluded SVG to a small D2. Normal EF  . CORONARY ARTERY BYPASS GRAFT  2006  . HAND SURGERY       Current Outpatient Medications  Medication Sig Dispense Refill  . ALPRAZolam (XANAX) 0.25 MG tablet Take 1 tablet (0.25 mg total) by mouth 3 (three) times daily as needed for anxiety. 90 tablet 1  . aspirin 325 MG EC tablet Take  325 mg by mouth daily.      Marland Kitchen atorvastatin (LIPITOR) 40 MG tablet Take 1 tablet (40 mg total) by mouth daily. Please keep upcoming appt for future refills. Thank you 90 tablet 0  . Clorazepate Dipotassium (TRANXENE-SD) 11.25 MG TB24 Take 11.25 mg by mouth as needed. Take 1/2 tablet as needed      . Cyanocobalamin (B-12 PO) Take 1 tablet by mouth daily.    Marland Kitchen escitalopram (LEXAPRO) 20 MG tablet Take 1 tablet (20 mg total) by mouth daily. 90 tablet 3  . esomeprazole (NEXIUM) 40 MG capsule Take 1 capsule (40 mg total) by mouth daily before breakfast. 90 capsule 3  . memantine (NAMENDA) 10 MG tablet Take 1 tablet (10 mg total) by mouth 2 (two) times daily. 180 tablet 3  . Multiple Vitamin (MULTIVITAMIN) capsule Take 1 capsule by mouth daily.      . nitroGLYCERIN (NITROSTAT) 0.4 MG SL tablet Place 1 tablet (0.4 mg total) under the tongue every 5 (five) minutes as needed. 25 tablet 1  . propranolol (INDERAL) 10 MG tablet Take 1 tablet (10 mg total) by mouth daily. Take one tablet by mouth at supper. Please keep upcoming appt for future refills. Thank you 90 tablet 0  . Tamsulosin HCl (FLOMAX) 0.4 MG CAPS Take 0.4 mg  by mouth daily.       No current facility-administered medications for this visit.     Allergies:   Captopril; Methyldopa; and Metoprolol    Social History:  The patient  reports that  has never smoked. he has never used smokeless tobacco. He reports that he does not drink alcohol or use drugs.   Family History:  The patient's Family history is unknown by patient.    ROS:  Please see the history of present illness.   Otherwise, review of systems are positive for none.   All other systems are reviewed and negative.    PHYSICAL EXAM: VS:  BP 122/60 (BP Location: Left Arm, Patient Position: Sitting, Cuff Size: Normal)   Pulse 75   Ht 5\' 8"  (1.727 m)   Wt 178 lb (80.7 kg)   BMI 27.06 kg/m  , BMI Body mass index is 27.06 kg/m. GEN: Well nourished, well developed, in no acute  distress HEENT: normal Neck: no JVD, right carotid bruit, or masses Cardiac: RRR; no murmurs, rubs, or gallops,no edema  Respiratory:  clear to auscultation bilaterally, normal work of breathing GI: soft, nontender, nondistended, + BS MS: no deformity or atrophy Skin: warm and dry, no rash Neuro:  Strength and sensation are intact Psych: euthymic mood, full affect   EKG:  EKG is ordered today. The ekg ordered today demonstrates : Normal sinus rhythm with anterolateral ST changes suggestive of ischemia.  These are chronic changes.  Recent Labs: No results found for requested labs within last 8760 hours.    Lipid Panel    Component Value Date/Time   CHOL 129 05/16/2013 1123   TRIG 37.0 05/16/2013 1123   HDL 54.60 05/16/2013 1123   CHOLHDL 2 05/16/2013 1123   VLDL 7.4 05/16/2013 1123   LDLCALC 67 05/16/2013 1123      Wt Readings from Last 3 Encounters:  05/21/17 178 lb (80.7 kg)  03/05/17 174 lb (78.9 kg)  05/19/16 176 lb (79.8 kg)      Other studies Reviewed:    ASSESSMENT AND PLAN:  1.  Coronary artery disease involving bypass grafts without angina:   Most recent nuclear stress test in 2016 was low risk.  He is doing well overall and has not required using nitroglycerin.  Continue medical therapy.  2. Moderate right carotid artery stenosis: Most recently, this was 60-79%.  This was repeated today and it is still stable.  Follow-up on a yearly basis.  3. Hyperlipidemia: This is followed by his primary care physician. Continue treatment with atorvastatin with a target LDL of less than 70.  4. History of symptomatic PVCs: These are well-controlled on small dose propranolol.  5. Essential hypertension: Blood pressure is well controlled on current medications   Disposition:   FU with me in 1 year  Signed, [name] Sacramento, MD  05/21/2017 4:39 PM    [place]

## 2017-06-03 ENCOUNTER — Telehealth: Payer: Self-pay | Admitting: Neurology

## 2017-06-03 NOTE — Telephone Encounter (Signed)
Patient's wife came by the office to drop off FMLA paper work. She also said that things were starting to progress? She said she has not used any but would like to have it if needed? She did not mention with the patient here as well what that was. Please Call. Thanks

## 2017-06-03 NOTE — Telephone Encounter (Signed)
Will complete paperwork

## 2017-06-09 NOTE — Telephone Encounter (Signed)
FMLA forms received and faxed to [employer] at [phone] with confirmation received.

## 2017-06-23 DIAGNOSIS — Z0279 Encounter for issue of other medical certificate: Secondary | ICD-10-CM

## 2017-07-06 ENCOUNTER — Telehealth: Payer: Self-pay | Admitting: Neurology

## 2017-07-06 DIAGNOSIS — F02818 Dementia in other diseases classified elsewhere, unspecified severity, with other behavioral disturbance: Secondary | ICD-10-CM

## 2017-07-06 DIAGNOSIS — G301 Alzheimer's disease with late onset: Principal | ICD-10-CM

## 2017-07-06 DIAGNOSIS — Z79899 Other long term (current) drug therapy: Secondary | ICD-10-CM

## 2017-07-06 DIAGNOSIS — F0281 Dementia in other diseases classified elsewhere with behavioral disturbance: Secondary | ICD-10-CM

## 2017-07-06 DIAGNOSIS — R443 Hallucinations, unspecified: Secondary | ICD-10-CM

## 2017-07-06 NOTE — Telephone Encounter (Signed)
Instead of 0.25 mg tid, have him take 2 at noon and one at night.  Refer to Dr. Casimiro Needle as well.

## 2017-07-06 NOTE — Telephone Encounter (Signed)
Patient's wife made aware. Referral sent to Dr. Casimiro Needle and she was given number to contact their office for an appt.

## 2017-07-06 NOTE — Telephone Encounter (Signed)
We can increase seroquel if needed.  He should be used to it enough that hopefully won't cause as much sleepiness.  If we cannot figure it out, can refer to geriatric psychiatry [facility]) for assist.  Janett Billow may be able to help with PACE or other resources too

## 2017-07-06 NOTE — Telephone Encounter (Signed)
[name] - he has tried multiple medications. Maybe you have some resources for her?  Dr. Carles Collet - any advise?

## 2017-07-06 NOTE — Telephone Encounter (Signed)
Patient's wife called regarding needing help with her husband. She said he "Roames" a lot. She is needing help to calm him. Usually from 12:30 pm to 5:30pm and he will not sit down. He calls her and says that he is very confused, then within 15 min he calls her back while she is at work. She was asking about an adult daycare? She is unsure of what direction to go? Please Call. Thanks

## 2017-07-06 NOTE — Telephone Encounter (Signed)
Spoke with patient's wife. She states she stopped Seroquel. She states this only made him worse. She went back to Xanax 0.25 mg - he is taking one tablet TID. She states in the morning and evening he is doing well. He is sleeping well at night. Has a caregiver with him 24/7. The problem is around 12:30-5 pm when he doesn't really have a set schedule and is at home he just wants to roam. He can't sit still. He is rearranging things. He is trying to leave the house. He usually meets wife at 4:30-5pm but often times the caregiver will leave an hour early because patient can't just sit at home.   Please advise.

## 2017-07-08 ENCOUNTER — Telehealth: Payer: Self-pay | Admitting: Psychology

## 2017-07-08 NOTE — Telephone Encounter (Signed)
Telephone call to patient's wife.  I left a detailed message letting her know that I would like to schedule a time to either meet with her in person or to you and talk with her on the telephone to provide options such as adult day programs, PACE programming or other resources that may be beneficial to both her and her spouse.  I am waiting on a return call back.

## 2017-07-09 ENCOUNTER — Other Ambulatory Visit: Payer: Self-pay | Admitting: Neurology

## 2017-07-09 ENCOUNTER — Other Ambulatory Visit: Payer: Self-pay | Admitting: Cardiovascular Disease

## 2017-07-09 MED FILL — PROPRANOLOL 10 MG TABLET: 10 | 90 days supply | Qty: 90 | Fill #0

## 2017-07-09 MED FILL — ALPRAZolam 0.25 MG TABS: 0.25 | 30 days supply | Qty: 90 | Fill #0

## 2017-07-09 MED FILL — ATORVASTATIN 40 MG TABLET: 40 | 90 days supply | Qty: 90 | Fill #0

## 2017-07-09 NOTE — Telephone Encounter (Signed)
Telephone call with patient's wife today.  We talked about the patient's current caregiving situation and the patient's current caregiving needs.  Adult daycare was discussed as an option and patient's wife has taken the patient to an adult daycare program close to where they live.  This may be a good solution for both the patient and caregiver as the program will keep the patient engaged during those hours that he is restless and at risk for roaming.    The plan is that the patient's wife is going to pursue a total day for her husband.  He will still receive some caregiving from his son and will transition into this program.  His wife was going to drop off the paperwork that needs to be completed by a medical provider next week at our office.  I will help complete this part of the paperwork for the patient.

## 2017-07-09 NOTE — Telephone Encounter (Signed)
Can you send prescription?

## 2017-07-29 MED FILL — ESOMEPRAZOLE MAG DR 40 MG C: 40 | 90 days supply | Qty: 90 | Fill #0

## 2017-08-24 MED FILL — ALPRAZolam 0.25 MG TABS: 0.25 | 30 days supply | Qty: 90 | Fill #1

## 2017-08-24 MED FILL — ESCITALOPRAM 20 MG TABLET: 20 | 90 days supply | Qty: 90 | Fill #0

## 2017-08-27 ENCOUNTER — Ambulatory Visit: Payer: 59 | Admitting: Neurology

## 2017-09-09 DIAGNOSIS — I6523 Occlusion and stenosis of bilateral carotid arteries: Secondary | ICD-10-CM | POA: Diagnosis not present

## 2017-09-09 DIAGNOSIS — L821 Other seborrheic keratosis: Secondary | ICD-10-CM | POA: Diagnosis not present

## 2017-09-09 DIAGNOSIS — G309 Alzheimer's disease, unspecified: Secondary | ICD-10-CM | POA: Diagnosis not present

## 2017-09-09 DIAGNOSIS — F341 Dysthymic disorder: Secondary | ICD-10-CM | POA: Diagnosis not present

## 2017-09-09 DIAGNOSIS — R5381 Other malaise: Secondary | ICD-10-CM | POA: Diagnosis not present

## 2017-09-09 DIAGNOSIS — G4733 Obstructive sleep apnea (adult) (pediatric): Secondary | ICD-10-CM | POA: Diagnosis not present

## 2017-09-09 DIAGNOSIS — R5383 Other fatigue: Secondary | ICD-10-CM | POA: Diagnosis not present

## 2017-09-09 DIAGNOSIS — I2510 Atherosclerotic heart disease of native coronary artery without angina pectoris: Secondary | ICD-10-CM | POA: Diagnosis not present

## 2017-09-09 DIAGNOSIS — E782 Mixed hyperlipidemia: Secondary | ICD-10-CM | POA: Diagnosis not present

## 2017-09-09 DIAGNOSIS — L578 Other skin changes due to chronic exposure to nonionizing radiation: Secondary | ICD-10-CM | POA: Diagnosis not present

## 2017-09-09 DIAGNOSIS — I10 Essential (primary) hypertension: Secondary | ICD-10-CM | POA: Diagnosis not present

## 2017-09-09 DIAGNOSIS — B351 Tinea unguium: Secondary | ICD-10-CM | POA: Diagnosis not present

## 2017-09-09 DIAGNOSIS — I471 Supraventricular tachycardia: Secondary | ICD-10-CM | POA: Diagnosis not present

## 2017-09-09 DIAGNOSIS — L570 Actinic keratosis: Secondary | ICD-10-CM | POA: Diagnosis not present

## 2017-09-09 DIAGNOSIS — N400 Enlarged prostate without lower urinary tract symptoms: Secondary | ICD-10-CM | POA: Diagnosis not present

## 2017-09-10 ENCOUNTER — Ambulatory Visit: Payer: 59 | Admitting: Neurology

## 2017-09-15 ENCOUNTER — Encounter

## 2017-09-15 ENCOUNTER — Ambulatory Visit (INDEPENDENT_AMBULATORY_CARE_PROVIDER_SITE_OTHER): Payer: 59 | Admitting: Psychiatry

## 2017-09-15 VITALS — BP 142/61 | HR 88 | Ht 68.0 in | Wt 176.0 lb

## 2017-09-15 DIAGNOSIS — F0391 Unspecified dementia with behavioral disturbance: Secondary | ICD-10-CM | POA: Diagnosis not present

## 2017-09-15 DIAGNOSIS — I2510 Atherosclerotic heart disease of native coronary artery without angina pectoris: Secondary | ICD-10-CM

## 2017-09-15 MED ORDER — CLORAZEPATE DIPOTASSIUM 7.5 MG PO TABS: ORAL_TABLET | ORAL | 4 refills | Status: DC

## 2017-09-15 NOTE — Progress Notes (Signed)
Psychiatric Initial Adult Assessment   Patient Identification: [name] MRN:  [id number] Date of Evaluation:  09/15/2017 Referral Source: Dr. Wells Guiles Tat Chief Complaint:   Visit Diagnosis: No diagnosis found.  History of Present Illness:  This patient is a 77 year old white male who is married and has a diagnosis of moderate Alzheimer's dementia. Presently lives with his wife [name] who is here for the interview. [place] He spends much of his day with his son, [name]. The patient was asked to be seen most likely because of issues with his mood. The patient and his wife deny that the patient is depressed. He rarely problems. He actually is sleeping and eating well. He's got good energy. He does little to enjoy himself but he doesn't seem distressed. Is not suicidal. He's never been violent. He doesn't times when he attempts to leave the home to go to another house's property. The family is very patient with. In my view within the patient is disoriented to date. Is well-groomed. The patient's wife who is married for 16 years works every day. The patient has one son who takes care of the patient. There is no evidence of alcohol use. Is no evidence of drug use. Patient demonstrates no psychotic symptoms he is no history of major depression or mania. He does not have symptoms consistent with a specific anxiety disorder like generalized anxiety disorder, panic disorder or OCD. The patient does have some obsessive-compulsive behaviors but it is not due to a psychiatric disorder. The patient has significant medical illnesses. He's had bypass surgery. The patient has no significant psychiatric history. He's never been in a psychiatric hospital his first evaluation by a psychiatrist. He's been tried on a number of different medicines which had burst of side effects. When he took Seroquel or Depakote seemed actually cause more confusion even hallucinations. At this time he takes a fixed  dose of Xanax 0.25 mg 3 times a day and takes Namenda. This patient does all her basic ADLs without problems. He does not of his institutional ADLs. No longer drives and all his needs are met by his family. During the interview the patient is calm and pleasant and appropriate.his wife describes that during the patient becomes restless and starts pacing. He is a hard time calming himself. He is waiting for the next past which is very difficult his wife from her workplace.  Associated Signs/Symptoms: Depression Symptoms:  impaired memory, (Hypo) Manic Symptoms:   Anxiety Symptoms:   Psychotic Symptoms:   PTSD Symptoms: NA  Past Psychiatric History: trials of Seroquel and Depakote.  Previous Psychotropic Medications: Yes   Substance Abuse History in the last 12 months:  No.  Consequences of Substance Abuse:   Past Medical History:  Past Medical History:  Diagnosis Date  . Carotid artery disease ([place])    on the right side -60-79%  . Coronary artery disease    prior bypass grafting. Most recent Myoview 2011 demonstrated no ischemia and normal left ventricular function  . Heart murmur   . Hyperlipidemia   . Memory loss   . Sleep apnea    noncompliant with CPAP  . Syncope and collapse    No arrhythmia on loop recorder. Likely orthostatic hypotension    Past Surgical History:  Procedure Laterality Date  . APPENDECTOMY    . CARDIAC CATHETERIZATION  2010   patent LIMA to LAD, SVG to D1, SVG to OM3, occluded SVG to a small D2. Normal EF  .  CORONARY ARTERY BYPASS GRAFT  2006  . HAND SURGERY      Family Psychiatric History:   Family History:  Family History  Family history unknown: Yes    Social History:   Social History   Socioeconomic History  . Marital status: Married    Spouse name: Not on file  . Number of children: Not on file  . Years of education: Not on file  . Highest education level: Not on file  Occupational History  . Occupation: retired    Comment: [employer]  . Financial resource strain: Not on file  . Food insecurity:    Worry: Not on file    Inability: Not on file  . Transportation needs:    Medical: Not on file    Non-medical: Not on file  Tobacco Use  . Smoking status: Never Smoker  . Smokeless tobacco: Never Used  Substance and Sexual Activity  . Alcohol use: No  . Drug use: No  . Sexual activity: Not on file  Lifestyle  . Physical activity:    Days per week: Not on file    Minutes per session: Not on file  . Stress: Not on file  Relationships  . Social connections:    Talks on phone: Not on file    Gets together: Not on file    Attends religious service: Not on file    Active member of club or organization: Not on file    Attends meetings of clubs or organizations: Not on file    Relationship status: Not on file  Other Topics Concern  . Not on file  Social History Narrative  . Not on file    Additional Social History:   Allergies:   Allergies  Allergen Reactions  . Captopril Other (See Comments)    unknown  . Methyldopa Other (See Comments)    unknown  . Metoprolol Other (See Comments)    unknown    Metabolic Disorder Labs: No results found for: HGBA1C, MPG No results found for: PROLACTIN Lab Results  Component Value Date   CHOL 129 05/16/2013   TRIG 37.0 05/16/2013   HDL 54.60 05/16/2013   CHOLHDL 2 05/16/2013   VLDL 7.4 05/16/2013   LDLCALC 67 05/16/2013     Current Medications: Current Outpatient Medications  Medication Sig Dispense Refill  . ALPRAZolam (XANAX) 0.25 MG tablet TAKE 1 TABLET BY MOUTH 3 TIMES DAILY AS NEEDED FOR ANXIETY 90 tablet 1  . aspirin 325 MG EC tablet Take 325 mg by mouth daily.      Marland Kitchen atorvastatin (LIPITOR) 40 MG tablet TAKE 1 TABLET (40 MG TOTAL) BY MOUTH DAILY 90 tablet 3  . clorazepate (TRANXENE-T) 7.5 MG tablet 1  At   11;00 am 30 tablet 4  . Clorazepate Dipotassium (TRANXENE-SD) 11.25 MG TB24 Take 11.25 mg by mouth as needed. Take 1/2 tablet as  needed      . Cyanocobalamin (B-12 PO) Take 1 tablet by mouth daily.    Marland Kitchen escitalopram (LEXAPRO) 20 MG tablet Take 1 tablet (20 mg total) by mouth daily. 90 tablet 3  . esomeprazole (NEXIUM) 40 MG capsule Take 1 capsule (40 mg total) by mouth daily before breakfast. 90 capsule 3  . memantine (NAMENDA) 10 MG tablet Take 1 tablet (10 mg total) by mouth 2 (two) times daily. 180 tablet 3  . Multiple Vitamin (MULTIVITAMIN) capsule Take 1 capsule by mouth daily.      . nitroGLYCERIN (NITROSTAT) 0.4 MG SL tablet  Place 1 tablet (0.4 mg total) under the tongue every 5 (five) minutes as needed. 25 tablet 1  . propranolol (INDERAL) 10 MG tablet TAKE 1 TABLET (10 MG TOTAL) BY MOUTH DAILY AT SUPPER 90 tablet 3  . Tamsulosin HCl (FLOMAX) 0.4 MG CAPS Take 0.4 mg by mouth daily.       No current facility-administered medications for this visit.     Neurologic: Headache: No Seizure: No Paresthesias:No  Musculoskeletal: Strength & Muscle Tone: within normal limits Gait & Station: normal Patient leans: N/A  Psychiatric Specialty Exam: ROS  Blood pressure (!) 142/61, pulse 88, height '5\' 8"'  (1.727 m), weight 176 lb (79.8 kg).Body mass index is 26.76 kg/m.  General Appearance: Fairly Groomed  Eye Contact:  Good  Speech:  Normal Rate  Volume:  Normal  Mood:  Negative  Affect:  Congruent  Thought Process:  Coherent  Orientation:  NA  Thought Content:  WDL  Suicidal Thoughts:  No  Homicidal Thoughts:  No  Memory:  Recent;   Poor  Judgement:  Fair  Insight:  Lacking  Psychomotor Activity:  Normal  Concentration:    Recall:  Poor  Fund of Knowledge:Fair  Language: Good  Akathisia:  No  Handed:    AIMS (if indicated):    Assets:  Desire for Improvement  ADL's:  Impaired  Cognition: Impaired,  Moderate  Sleep:      Treatment Plan Summary:  At this time the patient is actually fairly stable.has no violent or aggressive behavior. He is redirectable by his family when he attempts to leave  them,. His family is with him all the time and takes good care of him. He does not seem to be resistant to his care. I believe the Xanax 0.25 mg 3 times a day is a reasonable intervention. I would continue it. At this time I believe the best intervention start a small dose of Tranxene 7.5 mg at 11:00 in the morning. According his wife is about the time the patient becomes very restless and started calling her cell phone all the time. Is very anxious to get around. Ideally we should make an effort for the patient to be active in an activity that is simple and safe. The patient is visiting some day treatment programs but the patient seems resistant to go. Wife says the patient says that is not ready to go to such places. I think in some ways this would be ideal. Nonetheless at this time we'll add a small dose of Tranxene to reduce his anxiety and he'll return to see me in approximately 2 months. At that time he'll bring with him his son, [name] who spends all afternoon with. It is my hope that we can arrange some productive activity that'll keep the patient occupied in a safe way. I do not believe this patient is dangerous to himself or to anyone else.   Jerral Ralph, MD 7/3/20194:15 PM

## 2017-09-17 MED FILL — CLORAZEPATE 7.5 MG TABLET: 7.5 | 30 days supply | Qty: 30 | Fill #0

## 2017-10-05 ENCOUNTER — Telehealth: Payer: Self-pay | Admitting: Neurology

## 2017-10-05 NOTE — Telephone Encounter (Signed)
I know how to instruct on CBD oil, but didn't know if you had any advise for patient's wife on everything else. Please advise.

## 2017-10-05 NOTE — Telephone Encounter (Signed)
I'm assuming that is trazadone but that doesn't come in 7mg ?  I really don't have further suggestions except to f/u with psychiatry

## 2017-10-05 NOTE — Telephone Encounter (Signed)
[name] Brier [phone]  [place] called to see what your take is on trying CBD oil, she stated they have tried numerous medications and she took him to the psychiatrist and he added tranadon 7 mg along with xanax. Nothing seems to be working, as long as Itay is going during the day he does pretty good, but in the evening and night he gets a lot worse like last night  He lay ed down for a few minutes then he got up got dressed and them was ready to go. He gets angry, go back to the past, she called her nephew to come and help her calm him down last night, please advise,

## 2017-10-05 NOTE — Telephone Encounter (Signed)
Spoke with patient's wife and made her aware and shared Dr. Doristine Devoid standard response on CBD:   Not recommended by AAN because of lack of controlled trials.  In smaller trials marijuana helped tremor.  However, there is some data that suggests that it worsens cognition and falls.  The data also suggests that CBD oil is less effective than marijuana.  However, there have been concerns given the fact that CBD oil is unregulated and each manufacturer has different amounts of ingredient and the purity of each manufacturers ingredient has been called into question.  At this time, it is not recommended for the treatment of Parkinson's disease.  Further studies do need to be completed.

## 2017-10-06 ENCOUNTER — Other Ambulatory Visit: Payer: Self-pay | Admitting: Neurology

## 2017-10-06 MED FILL — ATORVASTATIN 40 MG TABLET: 40 | 90 days supply | Qty: 90 | Fill #1

## 2017-10-06 MED FILL — PROPRANOLOL 10 MG TABLET: 10 | 90 days supply | Qty: 90 | Fill #1

## 2017-10-06 MED FILL — TAMSULOSIN HCL 0.4 MG CAP: 0.4 | 90 days supply | Qty: 90 | Fill #1

## 2017-10-06 MED FILL — MEMANTINE HCL 10 MG TABLET: 10 | 90 days supply | Qty: 180 | Fill #0

## 2017-10-13 ENCOUNTER — Telehealth (HOSPITAL_COMMUNITY): Payer: Self-pay

## 2017-10-13 NOTE — Telephone Encounter (Signed)
Patients wife is calling, she states that patient is not doing any better with the addition of the Tranxene. He is still pacing and not resting. Patients wife says that he seems to be pushing himself to stay up and walking around because he is afraid if he sits too long he won't get back up. Please review and advise, thank you

## 2017-10-14 ENCOUNTER — Other Ambulatory Visit: Payer: Self-pay | Admitting: Neurology

## 2017-10-14 NOTE — Telephone Encounter (Signed)
Called to pharmacy 

## 2017-10-18 MED FILL — ALPRAZolam 0.25 MG TABS: 0.25 | 30 days supply | Qty: 90 | Fill #0

## 2017-10-19 ENCOUNTER — Encounter: Payer: Self-pay | Admitting: Gastroenterology

## 2017-10-21 NOTE — Telephone Encounter (Signed)
Per Dr. Casimiro Needle I called patients wife and advised her to stop the Tranxene for one week and then restart at a 1/2 dose. She voiced her understanding and will call if any further issues

## 2017-10-25 MED FILL — CLORAZEPATE 7.5 MG TABLET: 7.5 | 30 days supply | Qty: 30 | Fill #1

## 2017-11-01 MED FILL — ESOMEPRAZOLE MAG DR 40 MG C: 40 | 90 days supply | Qty: 90 | Fill #0

## 2017-11-04 ENCOUNTER — Ambulatory Visit (INDEPENDENT_AMBULATORY_CARE_PROVIDER_SITE_OTHER): Payer: 59 | Admitting: Psychiatry

## 2017-11-04 ENCOUNTER — Encounter (HOSPITAL_COMMUNITY): Payer: Self-pay | Admitting: Psychiatry

## 2017-11-04 VITALS — BP 144/66 | HR 66 | Ht 68.0 in | Wt 176.0 lb

## 2017-11-04 DIAGNOSIS — F02818 Dementia in other diseases classified elsewhere, unspecified severity, with other behavioral disturbance: Secondary | ICD-10-CM

## 2017-11-04 DIAGNOSIS — F0281 Dementia in other diseases classified elsewhere with behavioral disturbance: Secondary | ICD-10-CM

## 2017-11-04 DIAGNOSIS — G301 Alzheimer's disease with late onset: Secondary | ICD-10-CM | POA: Diagnosis not present

## 2017-11-04 DIAGNOSIS — I2510 Atherosclerotic heart disease of native coronary artery without angina pectoris: Secondary | ICD-10-CM

## 2017-11-04 MED ORDER — GABAPENTIN 300 MG PO CAPS: ORAL_CAPSULE | ORAL | 2 refills | Status: AC

## 2017-11-04 MED ORDER — HYDROXYZINE PAMOATE 50 MG PO CAPS: ORAL_CAPSULE | ORAL | 3 refills | Status: AC

## 2017-11-04 MED FILL — HYDROXYZINE PAM 50 MG CAP: 50 | 20 days supply | Qty: 20 | Fill #0

## 2017-11-04 MED FILL — GABAPENTIN 300 MG CAPSULE: 300 | 38 days supply | Qty: 150 | Fill #0

## 2017-11-04 NOTE — Progress Notes (Signed)
Psychiatric Initial Adult Assessment   Patient Identification: [name] MRN:  [id number] Date of Evaluation:  11/04/2017 Referral Source: Dr. Wells Guiles Tat Chief Complaint:   Visit Diagnosis: No diagnosis found.  History of Present Illness:   Patient is seen with his wife Glena. A lot has changed. It turns out that they says that the Tranxene and even the Xanax is making him worse. Even though when he came seemingly he was are taking Xanax and only doing fair. The effort of increasing the Tranxene and in retrospect there use of Xanax that that the believe that it made him more restless and agitated. This is a patient who whole family is involved in his care. Son is with him all the time. The patient has moderate dementia. He is well-dressed is very friendly and appropriate in this office. Issue is that the patient wants to wander away. He doesn't do it all the time all throughout the morning he is fine up until about 2 or 3:00 he then wants to leave once to walk around and doesn't want anybody to follow him or bother him. He can be very irritable and angry. He described is also almost a different person. Then by dinnertime and afterwards is fine. His back to himself. But his during that he is very agitated and aggressive He doesn't initiate anything but he is absolutely difficult to redirect. He gets angry anybody tries. The family dances around will do anything just to calm. The patient is actually sleeping and eating well. He shows no evidence of psychosis.  Associated Signs/Symptoms: Depression Symptoms:  impaired memory, (Hypo) Manic Symptoms:   Anxiety Symptoms:   Psychotic Symptoms:   PTSD Symptoms: NA  Past Psychiatric History: trials of Seroquel and Depakote.  Previous Psychotropic Medications: Yes   Substance Abuse History in the last 12 months:  No.  Consequences of Substance Abuse:   Past Medical History:  Past Medical History:  Diagnosis Date  . Carotid artery disease  ([place])    on the right side -60-79%  . Coronary artery disease    prior bypass grafting. Most recent Myoview 2011 demonstrated no ischemia and normal left ventricular function  . Heart murmur   . Hyperlipidemia   . Memory loss   . Sleep apnea    noncompliant with CPAP  . Syncope and collapse    No arrhythmia on loop recorder. Likely orthostatic hypotension    Past Surgical History:  Procedure Laterality Date  . APPENDECTOMY    . CARDIAC CATHETERIZATION  2010   patent LIMA to LAD, SVG to D1, SVG to OM3, occluded SVG to a small D2. Normal EF  . CORONARY ARTERY BYPASS GRAFT  2006  . HAND SURGERY      Family Psychiatric History:   Family History:  Family History  Family history unknown: Yes    Social History:   Social History   Socioeconomic History  . Marital status: Married    Spouse name: Not on file  . Number of children: Not on file  . Years of education: Not on file  . Highest education level: Not on file  Occupational History  . Occupation: retired    Comment: [employer]  . Financial resource strain: Not on file  . Food insecurity:    Worry: Not on file    Inability: Not on file  . Transportation needs:    Medical: Not on file    Non-medical: Not on file  Tobacco Use  . Smoking  status: Never Smoker  . Smokeless tobacco: Never Used  Substance and Sexual Activity  . Alcohol use: No  . Drug use: No  . Sexual activity: Not on file  Lifestyle  . Physical activity:    Days per week: Not on file    Minutes per session: Not on file  . Stress: Not on file  Relationships  . Social connections:    Talks on phone: Not on file    Gets together: Not on file    Attends religious service: Not on file    Active member of club or organization: Not on file    Attends meetings of clubs or organizations: Not on file    Relationship status: Not on file  Other Topics Concern  . Not on file  Social History Narrative  . Not on file    Additional Social  History:   Allergies:   Allergies  Allergen Reactions  . Captopril Other (See Comments)    unknown  . Methyldopa Other (See Comments)    unknown  . Metoprolol Other (See Comments)    unknown    Metabolic Disorder Labs: No results found for: HGBA1C, MPG No results found for: PROLACTIN Lab Results  Component Value Date   CHOL 129 05/16/2013   TRIG 37.0 05/16/2013   HDL 54.60 05/16/2013   CHOLHDL 2 05/16/2013   VLDL 7.4 05/16/2013   LDLCALC 67 05/16/2013     Current Medications: Current Outpatient Medications  Medication Sig Dispense Refill  . aspirin 325 MG EC tablet Take 325 mg by mouth daily.      Marland Kitchen atorvastatin (LIPITOR) 40 MG tablet TAKE 1 TABLET (40 MG TOTAL) BY MOUTH DAILY 90 tablet 3  . Cyanocobalamin (B-12 PO) Take 1 tablet by mouth daily.    Marland Kitchen escitalopram (LEXAPRO) 20 MG tablet Take 1 tablet (20 mg total) by mouth daily. 90 tablet 3  . esomeprazole (NEXIUM) 40 MG capsule Take 1 capsule (40 mg total) by mouth daily before breakfast. 90 capsule 3  . memantine (NAMENDA) 10 MG tablet TAKE 1 TABLET BY MOUTH 2 TIMES DAILY. 180 tablet 1  . Multiple Vitamin (MULTIVITAMIN) capsule Take 1 capsule by mouth daily.      . nitroGLYCERIN (NITROSTAT) 0.4 MG SL tablet Place 1 tablet (0.4 mg total) under the tongue every 5 (five) minutes as needed. 25 tablet 1  . propranolol (INDERAL) 10 MG tablet TAKE 1 TABLET (10 MG TOTAL) BY MOUTH DAILY AT SUPPER 90 tablet 3  . Tamsulosin HCl (FLOMAX) 0.4 MG CAPS Take 0.4 mg by mouth daily.      Marland Kitchen ALPRAZolam (XANAX) 0.25 MG tablet TAKE 1 TABLET BY MOUTH 3 TIMES DAILY AS NEEDED FOR ANXIETY (Patient not taking: Reported on 11/04/2017) 90 tablet 1  . clorazepate (TRANXENE-T) 7.5 MG tablet 1  At   11;00 am (Patient not taking: Reported on 11/04/2017) 30 tablet 4  . Clorazepate Dipotassium (TRANXENE-SD) 11.25 MG TB24 Take 11.25 mg by mouth as needed. Take 1/2 tablet as needed      . gabapentin (NEURONTIN) 300 MG capsule 1  qam   2  @ 11:00 am  3  q   4:00 PM 150 capsule 2  . hydrOXYzine (VISTARIL) 50 MG capsule 1  qday for agitation  PRN 20 capsule 3   No current facility-administered medications for this visit.     Neurologic: Headache: No Seizure: No Paresthesias:No  Musculoskeletal: Strength & Muscle Tone: within normal limits Gait & Station: normal Patient leans: N/A  Psychiatric Specialty Exam: ROS  Blood pressure (!) 144/66, pulse 66, height 5\' 8"  (1.727 m), weight 176 lb (79.8 kg), SpO2 97 %.Body mass index is 26.76 kg/m.  General Appearance: Fairly Groomed  Eye Contact:  Good  Speech:  Normal Rate  Volume:  Normal  Mood:  Negative  Affect:  Congruent  Thought Process:  Coherent  Orientation:  NA  Thought Content:  WDL  Suicidal Thoughts:  No  Homicidal Thoughts:  No  Memory:  Recent;   Poor  Judgement:  Fair  Insight:  Lacking  Psychomotor Activity:  Normal  Concentration:    Recall:  Poor  Fund of Knowledge:Fair  Language: Good  Akathisia:  No  Handed:    AIMS (if indicated):    Assets:  Desire for Improvement  ADL's:  Impaired  Cognition: Impaired,  Moderate  Sleep:      Treatment Plan Summary:  At this time the patient will officially discontinue his transient and Xanax. Today we will go ahead and treat him for his behavioral disturbance of agitation. His diagnosis is technically dementia with behavioral disturbance that of physical agitation. At this time we will begin him on Neurontin. He'll take 300 mg pill first thing in the morning, then he'll take 2 of them at 11 PM. Finally he'll take 3 at 4:00 PM. We'll give him Vistaril 50 mg to take 1 when necessary daily when he gets agitated. The possibility of using Tegretol at his next visit will be considered. The patient be asked to call back in 2 weeks specifically his wife calling to tell us how is doing. Today we spent more than 50% of the time counseling and educating about use of the different mood stabilizers and his medications. We shared how the  medication is going to start is nonaddictive has no long-term side effects. We talked about does not affect his weight and is cognitively sparing That is that she did not make his dementia worse.patient is not depressed. He denies chest pain shortness of breath or any neurological symptoms  Jerral Ralph, MD 8/22/20193:49 PM

## 2017-11-08 ENCOUNTER — Telehealth (HOSPITAL_COMMUNITY): Payer: Self-pay

## 2017-11-08 DIAGNOSIS — F0390 Unspecified dementia without behavioral disturbance: Secondary | ICD-10-CM | POA: Diagnosis not present

## 2017-11-08 DIAGNOSIS — I2510 Atherosclerotic heart disease of native coronary artery without angina pectoris: Secondary | ICD-10-CM | POA: Diagnosis not present

## 2017-11-08 DIAGNOSIS — F0391 Unspecified dementia with behavioral disturbance: Secondary | ICD-10-CM | POA: Diagnosis not present

## 2017-11-08 DIAGNOSIS — Z951 Presence of aortocoronary bypass graft: Secondary | ICD-10-CM | POA: Diagnosis not present

## 2017-11-08 DIAGNOSIS — R410 Disorientation, unspecified: Secondary | ICD-10-CM | POA: Diagnosis not present

## 2017-11-08 DIAGNOSIS — I10 Essential (primary) hypertension: Secondary | ICD-10-CM | POA: Diagnosis not present

## 2017-11-08 DIAGNOSIS — J45909 Unspecified asthma, uncomplicated: Secondary | ICD-10-CM | POA: Diagnosis not present

## 2017-11-08 DIAGNOSIS — Z7982 Long term (current) use of aspirin: Secondary | ICD-10-CM | POA: Diagnosis not present

## 2017-11-08 DIAGNOSIS — Z79899 Other long term (current) drug therapy: Secondary | ICD-10-CM | POA: Diagnosis not present

## 2017-11-08 NOTE — Telephone Encounter (Signed)
Patients wife called, patient is not getting any better on new medication. Patient is deteriorating - will not sit still, would not stay in church, wandering off, calling people from past. He is walking to the point that he is exhausting himself. Patients wife is really worried. Patient is also combative with family.

## 2017-11-12 ENCOUNTER — Ambulatory Visit: Payer: 59 | Admitting: Neurology

## 2017-11-12 NOTE — Telephone Encounter (Signed)
I spoke with Dr. Doyne Keel and she had me call patients wife to start back on the Xanax 0.25 tid again. I told patients wife that if he gets worse over the weekend to bring him to the ED. I also advised her that I added them to the cancellation list, if we get an opening sooner, we will call them in.

## 2017-11-23 ENCOUNTER — Telehealth: Payer: Self-pay | Admitting: Neurology

## 2017-11-23 NOTE — Telephone Encounter (Signed)
Patient's wife called back and state the day program he is in needs an RX to give him Xanax .25 mg TID PRN faxed to [phone]. They state that he is a little uncontrollable and she asked if this could be increased. Please advise.

## 2017-11-23 NOTE — Telephone Encounter (Signed)
Wife left message regarding a referral from Dr. Carles Collet to a Psychologist. The patient would like d to be referred to another Psychologist. Please Call. Thanks

## 2017-11-23 NOTE — Telephone Encounter (Signed)
Left message on machine for patient to call back.

## 2017-11-23 NOTE — Telephone Encounter (Signed)
Spoke with patient's wife. She states not having a very good relationship with current psychiatrist. She would like another recommendation. He does have [facility] ([facility]). He is currently off all psychiatric meds. She states he just kept getting worse and worse. Have had to call the cops/ambulance several times in a last few weeks. Worse around 2-4 pm every day. Please advise if you recommend another doctor.

## 2017-11-24 NOTE — Telephone Encounter (Signed)
Who did they see?

## 2017-11-24 NOTE — Telephone Encounter (Signed)
She had taken him off the Xanax and wasn't giving it at all, but he started a new day program yesterday and he was uncontrollable and were telling her that should would have to come get him if he didn't calm down. She wanted to know about giving an order to them for the Xanax and possibly increasing it.

## 2017-11-24 NOTE — Telephone Encounter (Signed)
Give information to Dr. Dennison Bulla and Dr. Marquis Lunch.  Is he using xanax tid?  Its generally not a medication I love in this age group, although I know we tried many others without success.

## 2017-11-24 NOTE — Telephone Encounter (Signed)
I see.  Lets try the 0.25 mg at day program but if not enough, will consider increasing

## 2017-11-24 NOTE — Telephone Encounter (Signed)
Dr Plovsky 

## 2017-11-24 NOTE — Telephone Encounter (Signed)
Order faxed to number provided.  Patient's wife made aware of other recommendations.

## 2017-12-08 ENCOUNTER — Ambulatory Visit (HOSPITAL_COMMUNITY): Payer: Self-pay | Admitting: Psychiatry

## 2017-12-09 MED FILL — ESCITALOPRAM 20 MG TABLET: 20 | 90 days supply | Qty: 90 | Fill #1

## 2017-12-27 MED FILL — ALPRAZolam 0.25 MG TABS: 0.25 | 30 days supply | Qty: 90 | Fill #1

## 2018-01-06 ENCOUNTER — Ambulatory Visit (INDEPENDENT_AMBULATORY_CARE_PROVIDER_SITE_OTHER): Payer: 59 | Admitting: Psychiatry

## 2018-01-06 ENCOUNTER — Encounter (HOSPITAL_COMMUNITY): Payer: Self-pay | Admitting: Psychiatry

## 2018-01-06 VITALS — BP 122/66 | HR 60 | Ht 67.0 in | Wt 175.0 lb

## 2018-01-06 DIAGNOSIS — G301 Alzheimer's disease with late onset: Principal | ICD-10-CM

## 2018-01-06 DIAGNOSIS — F0281 Dementia in other diseases classified elsewhere with behavioral disturbance: Secondary | ICD-10-CM

## 2018-01-06 DIAGNOSIS — F0391 Unspecified dementia with behavioral disturbance: Secondary | ICD-10-CM | POA: Diagnosis not present

## 2018-01-06 DIAGNOSIS — Z7982 Long term (current) use of aspirin: Secondary | ICD-10-CM

## 2018-01-06 MED ORDER — ALPRAZOLAM 0.25 MG PO TABS
ORAL_TABLET | ORAL | 4 refills | Status: DC
Start: 2018-01-06 — End: 2018-06-08

## 2018-01-06 NOTE — Progress Notes (Signed)
Psychiatric Initial Adult Assessment   Patient Identification: [name] MRN:  [id number] Date of Evaluation:  01/06/2018 Referral Source: Dr. Wells Guiles Tat Chief Complaint:   Visit Diagnosis: No diagnosis found.  History of Present Illness:   Today the patient is doing very well.  He seen with his wife.  About a month ago he began going to [facility] when [employer].  He was going 3 days a week but he was doing so well that they have actually increased him to coming 5 days a week.  At this center he helps out a great deal.  He shows no evidence of agitation and no evidence of wandering at all.  He is very engageable he is very friendly and he helps out a great deal.  It is perfect to get some purpose and passion.  There is no evidence of depression.  There is no evidence of psychosis.  He is not anxious.  I believe he likely has moderate dementia at this time.  He does not do any of the institutional ADLs.  He does all of his basic ADLs.  He no longer demonstrate any evidence of agitation.  He sleeps and eats well. Associated Signs/Symptoms: Depression Symptoms:  impaired memory, (Hypo) Manic Symptoms:   Anxiety Symptoms:   Psychotic Symptoms:   PTSD Symptoms: NA  Past Psychiatric History: trials of Seroquel and Depakote.  Previous Psychotropic Medications: Yes   Substance Abuse History in the last 12 months:  No.  Consequences of Substance Abuse:   Past Medical History:  Past Medical History:  Diagnosis Date  . Carotid artery disease ([place])    on the right side -60-79%  . Coronary artery disease    prior bypass grafting. Most recent Myoview 2011 demonstrated no ischemia and normal left ventricular function  . Heart murmur   . Hyperlipidemia   . Memory loss   . Sleep apnea    noncompliant with CPAP  . Syncope and collapse    No arrhythmia on loop recorder. Likely orthostatic hypotension    Past Surgical History:  Procedure Laterality Date  .  APPENDECTOMY    . CARDIAC CATHETERIZATION  2010   patent LIMA to LAD, SVG to D1, SVG to OM3, occluded SVG to a small D2. Normal EF  . CORONARY ARTERY BYPASS GRAFT  2006  . HAND SURGERY      Family Psychiatric History:   Family History:  Family History  Family history unknown: Yes    Social History:   Social History   Socioeconomic History  . Marital status: Married    Spouse name: Not on file  . Number of children: Not on file  . Years of education: Not on file  . Highest education level: Not on file  Occupational History  . Occupation: retired    Comment: [employer]  . Financial resource strain: Not on file  . Food insecurity:    Worry: Not on file    Inability: Not on file  . Transportation needs:    Medical: Not on file    Non-medical: Not on file  Tobacco Use  . Smoking status: Never Smoker  . Smokeless tobacco: Never Used  Substance and Sexual Activity  . Alcohol use: No  . Drug use: No  . Sexual activity: Not on file  Lifestyle  . Physical activity:    Days per week: Not on file    Minutes per session: Not on file  . Stress: Not on file  Relationships  . Social connections:    Talks on phone: Not on file    Gets together: Not on file    Attends religious service: Not on file    Active member of club or organization: Not on file    Attends meetings of clubs or organizations: Not on file    Relationship status: Not on file  Other Topics Concern  . Not on file  Social History Narrative  . Not on file    Additional Social History:   Allergies:   Allergies  Allergen Reactions  . Captopril Other (See Comments)    unknown  . Methyldopa Other (See Comments)    unknown  . Metoprolol Other (See Comments)    unknown    Metabolic Disorder Labs: No results found for: HGBA1C, MPG No results found for: PROLACTIN Lab Results  Component Value Date   CHOL 129 05/16/2013   TRIG 37.0 05/16/2013   HDL 54.60 05/16/2013   CHOLHDL 2 05/16/2013    VLDL 7.4 05/16/2013   LDLCALC 67 05/16/2013     Current Medications: Current Outpatient Medications  Medication Sig Dispense Refill  . ALPRAZolam (XANAX) 0.25 MG tablet TAKE 1 TABLET BY MOUTH 3 TIMES DAILY AS NEEDED FOR ANXIETY 90 tablet 4  . aspirin 325 MG EC tablet Take 325 mg by mouth daily.      Marland Kitchen atorvastatin (LIPITOR) 40 MG tablet TAKE 1 TABLET (40 MG TOTAL) BY MOUTH DAILY 90 tablet 3  . Cyanocobalamin (B-12 PO) Take 1 tablet by mouth daily.    Marland Kitchen escitalopram (LEXAPRO) 20 MG tablet Take 1 tablet (20 mg total) by mouth daily. 90 tablet 3  . esomeprazole (NEXIUM) 40 MG capsule Take 1 capsule (40 mg total) by mouth daily before breakfast. 90 capsule 3  . memantine (NAMENDA) 10 MG tablet TAKE 1 TABLET BY MOUTH 2 TIMES DAILY. 180 tablet 1  . Multiple Vitamin (MULTIVITAMIN) capsule Take 1 capsule by mouth daily.      . nitroGLYCERIN (NITROSTAT) 0.4 MG SL tablet Place 1 tablet (0.4 mg total) under the tongue every 5 (five) minutes as needed. 25 tablet 1  . propranolol (INDERAL) 10 MG tablet TAKE 1 TABLET (10 MG TOTAL) BY MOUTH DAILY AT SUPPER 90 tablet 3  . Tamsulosin HCl (FLOMAX) 0.4 MG CAPS Take 0.4 mg by mouth daily.      . clorazepate (TRANXENE-T) 7.5 MG tablet 1  At   11;00 am (Patient not taking: Reported on 11/04/2017) 30 tablet 4  . Clorazepate Dipotassium (TRANXENE-SD) 11.25 MG TB24 Take 11.25 mg by mouth as needed. Take 1/2 tablet as needed      . gabapentin (NEURONTIN) 300 MG capsule 1  qam   2  @ 11:00 am  3  q  4:00 PM (Patient not taking: Reported on 01/06/2018) 150 capsule 2  . hydrOXYzine (VISTARIL) 50 MG capsule 1  qday for agitation  PRN (Patient not taking: Reported on 01/06/2018) 20 capsule 3   No current facility-administered medications for this visit.     Neurologic: Headache: No Seizure: No Paresthesias:No  Musculoskeletal: Strength & Muscle Tone: within normal limits Gait & Station: normal Patient leans: N/A  Psychiatric Specialty Exam: ROS  Blood  pressure 122/66, pulse 60, height 5\' 7"  (1.702 m), weight 175 lb (79.4 kg), SpO2 94 %.Body mass index is 27.41 kg/m.  General Appearance: Fairly Groomed  Eye Contact:  Good  Speech:  Normal Rate  Volume:  Normal  Mood:  Negative  Affect:  Congruent  Thought Process:  Coherent  Orientation:  NA  Thought Content:  WDL  Suicidal Thoughts:  No  Homicidal Thoughts:  No  Memory:  Recent;   Poor  Judgement:  Fair  Insight:  Lacking  Psychomotor Activity:  Normal  Concentration:    Recall:  Poor  Fund of Knowledge:Fair  Language: Good  Akathisia:  No  Handed:    AIMS (if indicated):    Assets:  Desire for Improvement  ADL's:  Impaired  Cognition: Impaired,  Moderate  Sleep:      Treatment Plan Summary:  At this time the patient's diagnosis is dementia with behavioral disturbance.  At this time this is nearly resolved.  He takes a very low-dose of Xanax 0.25 mg 3 times daily.  He sees a primary care doctor at [facility].  At this time he will continue his care by his primary care doctor.  We will give him enough Xanax to last for 3 or 4 months from now on all of his medications will be obtained from his primary care doctor.  At this time the patient is doing very well.  His agitation seems to be resolved.  He is a structured safe environment that he is in.  His wife is very pleased.  Daneil Dan in this phase of dementia he seems to be very stable.  He was invited to come back if there are any problems or anything new that happens.  For now we will and/or care. Jerral Ralph, MD 10/24/20193:35 PM

## 2018-02-02 MED FILL — TAMSULOSIN HCL 0.4 MG CAP: 0.4 | 90 days supply | Qty: 90 | Fill #0

## 2018-02-02 MED FILL — ALPRAZolam 0.25 MG TABS: 0.25 | 30 days supply | Qty: 90 | Fill #0

## 2018-02-02 MED FILL — ESOMEPRAZOLE MAG DR 40 MG C: 40 | 90 days supply | Qty: 90 | Fill #0

## 2018-02-02 MED FILL — ATORVASTATIN 40 MG TABLET: 40 | 90 days supply | Qty: 90 | Fill #2

## 2018-02-02 MED FILL — MEMANTINE HCL 10 MG TABLET: 10 | 90 days supply | Qty: 180 | Fill #1

## 2018-02-02 MED FILL — PROPRANOLOL 10 MG TABLET: 10 | 90 days supply | Qty: 90 | Fill #2

## 2018-03-11 MED FILL — ALPRAZolam 0.25 MG TABS: 0.25 | 30 days supply | Qty: 90 | Fill #1

## 2018-03-18 ENCOUNTER — Ambulatory Visit: Payer: 59 | Admitting: Neurology

## 2018-03-24 ENCOUNTER — Telehealth: Payer: Self-pay | Admitting: Neurology

## 2018-03-24 NOTE — Telephone Encounter (Signed)
Patient's wife called and is needing to see if his Xanax medication can be increased from 25 MG to 50 MG. He is in [facility] and is seeming to get agitated easily. She said he needs to stay busy. [facility] is needing an order faxed over to them. Thanks

## 2018-03-25 NOTE — Telephone Encounter (Signed)
Spoke with patient's wife. He is taking one dose at 7 am, one at 11:30 am, and one at 3 pm. I did make her aware that Dr. Carles Collet was not comfortable increasing medication further. She states that the patient still feels very anxious and is even asking for his "nervous pills" now. She did not follow up with Dr Cottle/ Dr. Toy Care, they are still seeing Dr. Casimiro Needle (they decided not to switch providers). I advised that he would be a better provider to speak with about managing his anxiety if they were going to continue follow up with him. She expressed understanding and did ask about need to keep following here. They will keep his next scheduled visit to discuss need for continued follow up here.

## 2018-03-25 NOTE — Telephone Encounter (Signed)
See phone note from September. You had added Xanax .25 mg during his time at [facility], but it states you would consider increasing this if needed.  Please advise.

## 2018-03-25 NOTE — Telephone Encounter (Signed)
hes at [facility] for all 3 of the dosages?  I can't increase the xanax that much.

## 2018-03-25 NOTE — Telephone Encounter (Signed)
It was for Xanax 0.25 mg - 1 tablet TID PRN.

## 2018-03-25 NOTE — Telephone Encounter (Signed)
How many dosages is this?

## 2018-04-05 ENCOUNTER — Telehealth (HOSPITAL_COMMUNITY): Payer: Self-pay

## 2018-04-05 MED FILL — ESCITALOPRAM 20 MG TABLET: 20 | 90 days supply | Qty: 90 | Fill #0

## 2018-04-05 NOTE — Telephone Encounter (Signed)
Patients wife is calling, she states that patient is in memory care at [facility] and that the staff had noticed he was becoming agitated very easily and asked patients wife about increasing the Xanax. Patients wife decided to try restarting the Tranxene 7.5 mg at 11 am and that seems to be doing well, she needs an order for [facility] that it is okay to give it to him. Please review and advise, thank you

## 2018-04-08 ENCOUNTER — Other Ambulatory Visit (HOSPITAL_COMMUNITY): Payer: Self-pay

## 2018-04-08 DIAGNOSIS — Z961 Presence of intraocular lens: Secondary | ICD-10-CM | POA: Diagnosis not present

## 2018-04-08 DIAGNOSIS — H353131 Nonexudative age-related macular degeneration, bilateral, early dry stage: Secondary | ICD-10-CM | POA: Diagnosis not present

## 2018-04-08 DIAGNOSIS — Z8669 Personal history of other diseases of the nervous system and sense organs: Secondary | ICD-10-CM | POA: Diagnosis not present

## 2018-04-08 MED ORDER — CLORAZEPATE DIPOTASSIUM 7.5 MG PO TABS: ORAL_TABLET | ORAL | 4 refills | Status: DC

## 2018-04-09 ENCOUNTER — Telehealth (HOSPITAL_COMMUNITY): Payer: Self-pay

## 2018-04-09 NOTE — Telephone Encounter (Signed)
per a message left on desk when i came into work rx for trannxene -t 7.5mg  was called into pharmacy voice mail. order # [id number] id # [id number] #30 with 4 refills.

## 2018-04-11 MED FILL — ALPRAZolam 0.25 MG TABS: 0.25 | 30 days supply | Qty: 90 | Fill #2

## 2018-04-13 MED FILL — CLORAZEPATE 7.5 MG TABLET: 7.5 | 30 days supply | Qty: 30 | Fill #0

## 2018-04-19 ENCOUNTER — Ambulatory Visit: Payer: 59 | Admitting: Neurology

## 2018-04-19 NOTE — Telephone Encounter (Signed)
A new order was sent to [facility] for the Tranxene 7.5 mg at 11 am

## 2018-04-26 DIAGNOSIS — I10 Essential (primary) hypertension: Secondary | ICD-10-CM | POA: Diagnosis not present

## 2018-04-26 DIAGNOSIS — I471 Supraventricular tachycardia: Secondary | ICD-10-CM | POA: Diagnosis not present

## 2018-04-26 DIAGNOSIS — G309 Alzheimer's disease, unspecified: Secondary | ICD-10-CM | POA: Diagnosis not present

## 2018-04-26 DIAGNOSIS — N400 Enlarged prostate without lower urinary tract symptoms: Secondary | ICD-10-CM | POA: Diagnosis not present

## 2018-04-26 DIAGNOSIS — E782 Mixed hyperlipidemia: Secondary | ICD-10-CM | POA: Diagnosis not present

## 2018-04-26 DIAGNOSIS — R5383 Other fatigue: Secondary | ICD-10-CM | POA: Diagnosis not present

## 2018-04-26 DIAGNOSIS — R5381 Other malaise: Secondary | ICD-10-CM | POA: Diagnosis not present

## 2018-04-26 DIAGNOSIS — I6523 Occlusion and stenosis of bilateral carotid arteries: Secondary | ICD-10-CM | POA: Diagnosis not present

## 2018-04-26 DIAGNOSIS — I2510 Atherosclerotic heart disease of native coronary artery without angina pectoris: Secondary | ICD-10-CM | POA: Diagnosis not present

## 2018-04-26 DIAGNOSIS — G4733 Obstructive sleep apnea (adult) (pediatric): Secondary | ICD-10-CM | POA: Diagnosis not present

## 2018-04-26 DIAGNOSIS — F341 Dysthymic disorder: Secondary | ICD-10-CM | POA: Diagnosis not present

## 2018-05-02 ENCOUNTER — Other Ambulatory Visit: Payer: Self-pay

## 2018-05-03 MED ORDER — NITROGLYCERIN 0.4 MG SL SUBL: 0.4000 mg | SUBLINGUAL_TABLET | SUBLINGUAL | 0 refills | Status: AC | PRN

## 2018-05-03 MED FILL — TAMSULOSIN HCL 0.4 MG CAP: 0.4 | 90 days supply | Qty: 90 | Fill #0

## 2018-05-03 MED FILL — PROPRANOLOL HCL 10 MG TAB: 10 | 90 days supply | Qty: 90 | Fill #3

## 2018-05-03 MED FILL — ATORVASTATIN 40 MG TABLET: 40 | 90 days supply | Qty: 90 | Fill #3

## 2018-05-03 MED FILL — NITROGLYCERIN 0.4 MG TAB SL: 0.4 | 13 days supply | Qty: 25 | Fill #0

## 2018-05-06 ENCOUNTER — Ambulatory Visit: Payer: 59 | Admitting: Psychiatry

## 2018-05-11 ENCOUNTER — Telehealth (HOSPITAL_COMMUNITY): Payer: Self-pay

## 2018-05-11 NOTE — Telephone Encounter (Signed)
Yes   That's ' ok

## 2018-05-11 NOTE — Telephone Encounter (Signed)
Patients wife is calling, she wants to know if they can cut the tranxene in half. She states patient is doing better by the 7.5 may be a little too much. Please review and advise, thank you

## 2018-05-17 MED FILL — ESOMEPRAZOLE MAG DR 40 MG C: 40 | 90 days supply | Qty: 90 | Fill #0

## 2018-05-17 MED FILL — ALPRAZolam 0.25 MG TABS: 0.25 | 30 days supply | Qty: 90 | Fill #3

## 2018-05-19 ENCOUNTER — Ambulatory Visit (INDEPENDENT_AMBULATORY_CARE_PROVIDER_SITE_OTHER): Payer: 59 | Admitting: Psychiatry

## 2018-05-19 DIAGNOSIS — F0281 Dementia in other diseases classified elsewhere with behavioral disturbance: Secondary | ICD-10-CM

## 2018-05-19 DIAGNOSIS — G301 Alzheimer's disease with late onset: Secondary | ICD-10-CM | POA: Diagnosis not present

## 2018-05-19 DIAGNOSIS — I25810 Atherosclerosis of coronary artery bypass graft(s) without angina pectoris: Secondary | ICD-10-CM | POA: Diagnosis not present

## 2018-05-19 NOTE — Progress Notes (Signed)
[facility] MD/PA/NP Initial Note  05/19/2018 4:27 PM [name]  MRN:  [id number]  Chief Complaint: alzheimer's dementia  HPI: pt. [facility]. Has seen Dr. Carles Collet, local neurologist who dx with dementa. She put pt on xanax for treatment. Then pt saw Dr. Casimiro Needle. Who started pt on tranxene and gabapentin with too much sedation. Wife states pt with Alzheimers for 6 years and slowly worsening.He gets agitated in the midddle of the day. Unable to redirect agitatioin Patient is always on the go.  Dementia  starts early afternoon.   ADLs overall good.  Stays at [facility] all day while his wife is at work. Depression for about 40+ years it comes and goes is not bad now.  Negative active suicidal thoughts does have passive suicidal at that time wishing he would not wake up. Anxious is a lot of the time symptoms for 2 years and it is worsening he does not appear to have much of a panic attack he does get short of breath with heart racing. Eating disorder negative Psychosis talks about his children and also PA daily Have delusions someone takes a staff if he loses things he blames someone.  He is paranoid people take his stuff. OCD checking, obsessive thinking.  Good mood more than bad talking more no deep not impulsive not grandiose not goal oriented good mood last 1 to 3 days. Moods change quickly if he is agitated.  If he gets angry he had only last several hours.  He will walk away from the situation if he is irritated. MDQ head for positives.  It was a moderate problem.  Visit Diagnosis: dementia  Past Psychiatric History: Patient has had four-vessel bypass surgery in 2006.  He has had hypertension prior to his CABG surgery.  He has elevated cholesterol.  No diabetes.  He does have sleep apnea.  past psychiatric history is has been listed in the HPI.  He did have a bad reaction to gabapentin.   Past Medical History: See above.     Past Medical History:   Diagnosis Date  . Carotid artery disease ([place])    on the right side -60-79%  . Coronary artery disease    prior bypass grafting. Most recent Myoview 2011 demonstrated no ischemia and normal left ventricular function  . Heart murmur   . Hyperlipidemia   . Memory loss   . Sleep apnea    noncompliant with CPAP  . Syncope and collapse    No arrhythmia on loop recorder. Likely orthostatic hypotension    Past Surgical History:  Procedure Laterality Date  . APPENDECTOMY    . CARDIAC CATHETERIZATION  2010   patent LIMA to LAD, SVG to D1, SVG to OM3, occluded SVG to a small D2. Normal EF  . CORONARY ARTERY BYPASS GRAFT  2006  . HAND SURGERY      Family Psychiatric History: He does have 1 relative with OCD.  Patient also has a younger brother who would did commit suicide.  Family History: Medical history positive for diabetes sudden death and possibly an MI. Family History  Family history unknown: Yes    Social History: Patient is retired from farming.  He is married his wife name is [place]  She is Librarian, academic of [employer] for 10 years.  Has 3 children and religious belief he is [place]  no legal problems Patient drinks 2-3 caffeinated beverages a day.  Denies alcohol and tobacco use.  Denies any drug use.  Past history negative for drugs and he has some alcohol in the past. Social History   Socioeconomic History  . Marital status: Married    Spouse name: Not on file  . Number of children: Not on file  . Years of education: Not on file  . Highest education level: Not on file  Occupational History  . Occupation: retired    Comment: [employer]  . Financial resource strain: Not on file  . Food insecurity:    Worry: Not on file    Inability: Not on file  . Transportation needs:    Medical: Not on file    Non-medical: Not on file  Tobacco Use  . Smoking status: Never Smoker  . Smokeless tobacco: Never Used  Substance and Sexual Activity  . Alcohol use: No  .  Drug use: No  . Sexual activity: Not on file  Lifestyle  . Physical activity:    Days per week: Not on file    Minutes per session: Not on file  . Stress: Not on file  Relationships  . Social connections:    Talks on phone: Not on file    Gets together: Not on file    Attends religious service: Not on file    Active member of club or organization: Not on file    Attends meetings of clubs or organizations: Not on file    Relationship status: Not on file  Other Topics Concern  . Not on file  Social History Narrative  . Not on file    Allergies:  Allergies  Allergen Reactions  . Captopril Other (See Comments)    unknown  . Methyldopa Other (See Comments)    unknown  . Metoprolol Other (See Comments)    unknown    Metabolic Disorder Labs: No results found for: HGBA1C, MPG No results found for: PROLACTIN Lab Results  Component Value Date   CHOL 129 05/16/2013   TRIG 37.0 05/16/2013   HDL 54.60 05/16/2013   CHOLHDL 2 05/16/2013   VLDL 7.4 05/16/2013   LDLCALC 67 05/16/2013   No results found for: TSH  Therapeutic Level Labs: No results found for: LITHIUM No results found for: VALPROATE No components found for:  CBMZ  Current Medications: Patient is on propranolol, atorvastatin, tamsulosin, Xanax, vitamin B complex, multivitamin, Lexapro 20 mg a day, memantine HCL 10 mg twice daily, Nexium, nitroglycerin as needed Current Outpatient Medications  Medication Sig Dispense Refill  . ALPRAZolam (XANAX) 0.25 MG tablet TAKE 1 TABLET BY MOUTH 3 TIMES DAILY AS NEEDED FOR ANXIETY 90 tablet 4  . aspirin 325 MG EC tablet Take 325 mg by mouth daily.      Marland Kitchen atorvastatin (LIPITOR) 40 MG tablet TAKE 1 TABLET (40 MG TOTAL) BY MOUTH DAILY 90 tablet 3  . clorazepate (TRANXENE-T) 7.5 MG tablet 1  At   11;00 am 30 tablet 4  . Clorazepate Dipotassium (TRANXENE-SD) 11.25 MG TB24 Take 11.25 mg by mouth as needed. Take 1/2 tablet as needed      . Cyanocobalamin (B-12 PO) Take 1 tablet by  mouth daily.    Marland Kitchen escitalopram (LEXAPRO) 20 MG tablet Take 1 tablet (20 mg total) by mouth daily. 90 tablet 3  . esomeprazole (NEXIUM) 40 MG capsule Take 1 capsule (40 mg total) by mouth daily before breakfast. 90 capsule 3  . gabapentin (NEURONTIN) 300 MG capsule 1  qam   2  @ 11:00 am  3  q  4:00 PM (Patient not  taking: Reported on 01/06/2018) 150 capsule 2  . hydrOXYzine (VISTARIL) 50 MG capsule 1  qday for agitation  PRN (Patient not taking: Reported on 01/06/2018) 20 capsule 3  . memantine (NAMENDA) 10 MG tablet TAKE 1 TABLET BY MOUTH 2 TIMES DAILY. 180 tablet 1  . Multiple Vitamin (MULTIVITAMIN) capsule Take 1 capsule by mouth daily.      . nitroGLYCERIN (NITROSTAT) 0.4 MG SL tablet Place 1 tablet (0.4 mg total) under the tongue every 5 (five) minutes as needed. 25 tablet 0  . propranolol (INDERAL) 10 MG tablet TAKE 1 TABLET (10 MG TOTAL) BY MOUTH DAILY AT SUPPER 90 tablet 3  . Tamsulosin HCl (FLOMAX) 0.4 MG CAPS Take 0.4 mg by mouth daily.       No current facility-administered medications for this visit.     Medication Side Effects: none  Orders placed this visit:  No orders of the defined types were placed in this encounter.   Psychiatric Specialty Exam:  ROS neg except has OSA  Patient blood pressure is 158/77 with a pulse of 60.  Weight is 172 with a height of 5 7-3/4 inches  General Appearance: Casual  Eye Contact:  Fair  Speech:  Clear and Coherent  Volume:  Decreased  Mood:  Depressed and anxious.  Affect:  Constricted  Thought Process:  Linear  Orientation:  fair  Thought Content: doesn[t talk much during interview.  Suicidal Thoughts:  No  Homicidal Thoughts:  No  Memory:  impaired  Judgement:  Poor  Insight:  Fair  Psychomotor Activity:  Decreased  Concentration:  Concentration: Good  Recall:  Belva of Knowledge: Fair  Language: Fair  Assets:  Desire for Improvement  ADL's:  Intact  Cognition: Impaired,  Moderate  Prognosis:  Fair   Screenings:   Mini-Mental     Office Visit from 05/18/2014 in [facility] Visit from 10/21/2012 in [facility]  Total Score (max 30 points )  21  19      Receiving Psychotherapy: No   Treatment Plan/Recommendations: Patient's diagnosis is dementia, depression, paranoia.  At her next visit I would like to get a release of information from Dr. Wells Guiles Tat,neurologist.  So need to see what dementia work-up he he has had  Continuing his Xanax 0.25 at 3 a day we will decrease it to 2 a day in the future also will consider Ativan versus Xanax.  As mentioned patient is on Xanax Lexapro and memantine. Patient will follow-up in 1 month.  At that time we will get a release from Dr. Carles Collet as mentioned see what work-up dementia labs is been done.      Comer Locket, PA-C

## 2018-06-06 ENCOUNTER — Telehealth: Payer: Self-pay | Admitting: Psychiatry

## 2018-06-06 NOTE — Telephone Encounter (Signed)
Pt wife Glenna called. Need appt ASAP. Pt is wandering at night. Seeing things and getting aggressive. Please advise when to schedule.

## 2018-06-08 ENCOUNTER — Other Ambulatory Visit: Payer: Self-pay

## 2018-06-08 ENCOUNTER — Ambulatory Visit (INDEPENDENT_AMBULATORY_CARE_PROVIDER_SITE_OTHER): Payer: 59 | Admitting: Psychiatry

## 2018-06-08 ENCOUNTER — Encounter (HOSPITAL_COMMUNITY): Payer: Self-pay | Admitting: Psychiatry

## 2018-06-08 VITALS — BP 147/69 | HR 62 | Temp 97.6°F | Ht 68.0 in | Wt 176.0 lb

## 2018-06-08 DIAGNOSIS — I25810 Atherosclerosis of coronary artery bypass graft(s) without angina pectoris: Secondary | ICD-10-CM

## 2018-06-08 DIAGNOSIS — G301 Alzheimer's disease with late onset: Secondary | ICD-10-CM | POA: Diagnosis not present

## 2018-06-08 DIAGNOSIS — F0281 Dementia in other diseases classified elsewhere with behavioral disturbance: Secondary | ICD-10-CM

## 2018-06-08 MED ORDER — ALPRAZOLAM 0.25 MG PO TABS: ORAL_TABLET | ORAL | 4 refills | Status: AC

## 2018-06-08 MED ORDER — TRAZODONE HCL 50 MG PO TABS: ORAL_TABLET | ORAL | 5 refills | Status: AC

## 2018-06-08 NOTE — Progress Notes (Signed)
Psychiatric Initial Adult Assessment   Patient Identification: [name] MRN:  [id number] Date of Evaluation:  06/08/2018 Referral Source: Dr. Wells Guiles Tat Chief Complaint:   Visit Diagnosis: Dementia with behavioral disturbance  History of Present Illness:    Today the patient is seen with his wife, [name].  The patient was doing quite well up until about a week and a half ago.  At that time his day program [facility] had to close down because of the virus in the community.  The patient therefore has had to be home with his wife who now is not working in her workplace.  She try to make arrangements so that she can work from home.  Overall the patient is doing great up until about 2 or 3:00 in the afternoon.  Then he has a hard time sitting still.  He still redirectable but he is very very restless and cannot sit still.  He needs something to do.  There have been a couple of times where he is attempted to elope.  Fortunately the patient has his wife and his sons who help him a lot.  The patient continues to eat well.  He demonstrates no evidence of psychosis.  Most the time is calm and cooperative.  He is still fairly easy to redirect.  His wife knows how to deal with him.  He does not appear depressed in any way.  He has been taking Xanax 0.25 mg 3 times daily done fairly well.  He can still do all of his basic ADLs but none of his instrumental ADLs.  In the past we have tried gabapentin and he had significant side effects.  At this time is been having new problems really sleeping.  The patient is not physically violent.  He is not suicidal. Associated Signs/Symptoms: Depression Symptoms:  impaired memory, (Hypo) Manic Symptoms:   Anxiety Symptoms:   Psychotic Symptoms:   PTSD Symptoms: NA  Past Psychiatric History: trials of Seroquel and Depakote.  Previous Psychotropic Medications: Yes   Substance Abuse History in the last 12 months:  No.  Consequences of Substance  Abuse:   Past Medical History:  Past Medical History:  Diagnosis Date  . Carotid artery disease ([place])    on the right side -60-79%  . Coronary artery disease    prior bypass grafting. Most recent Myoview 2011 demonstrated no ischemia and normal left ventricular function  . Heart murmur   . Hyperlipidemia   . Memory loss   . Sleep apnea    noncompliant with CPAP  . Syncope and collapse    No arrhythmia on loop recorder. Likely orthostatic hypotension    Past Surgical History:  Procedure Laterality Date  . APPENDECTOMY    . CARDIAC CATHETERIZATION  2010   patent LIMA to LAD, SVG to D1, SVG to OM3, occluded SVG to a small D2. Normal EF  . CORONARY ARTERY BYPASS GRAFT  2006  . HAND SURGERY      Family Psychiatric History:   Family History:  Family History  Family history unknown: Yes    Social History:   Social History   Socioeconomic History  . Marital status: Married    Spouse name: Not on file  . Number of children: Not on file  . Years of education: Not on file  . Highest education level: Not on file  Occupational History  . Occupation: retired    Comment: [employer]  . Financial resource strain: Not on file  .  Food insecurity:    Worry: Not on file    Inability: Not on file  . Transportation needs:    Medical: Not on file    Non-medical: Not on file  Tobacco Use  . Smoking status: Never Smoker  . Smokeless tobacco: Never Used  Substance and Sexual Activity  . Alcohol use: No  . Drug use: No  . Sexual activity: Not on file  Lifestyle  . Physical activity:    Days per week: Not on file    Minutes per session: Not on file  . Stress: Not on file  Relationships  . Social connections:    Talks on phone: Not on file    Gets together: Not on file    Attends religious service: Not on file    Active member of club or organization: Not on file    Attends meetings of clubs or organizations: Not on file    Relationship status: Not on file  Other  Topics Concern  . Not on file  Social History Narrative  . Not on file    Additional Social History:   Allergies:   Allergies  Allergen Reactions  . Captopril Other (See Comments)    unknown  . Methyldopa Other (See Comments)    unknown  . Metoprolol Other (See Comments)    unknown    Metabolic Disorder Labs: No results found for: HGBA1C, MPG No results found for: PROLACTIN Lab Results  Component Value Date   CHOL 129 05/16/2013   TRIG 37.0 05/16/2013   HDL 54.60 05/16/2013   CHOLHDL 2 05/16/2013   VLDL 7.4 05/16/2013   LDLCALC 67 05/16/2013     Current Medications: Current Outpatient Medications  Medication Sig Dispense Refill  . ALPRAZolam (XANAX) 0.25 MG tablet 1  qam   2  q noon   2  q diner  1  Prn  qday 180 tablet 4  . aspirin 325 MG EC tablet Take 325 mg by mouth daily.      Marland Kitchen atorvastatin (LIPITOR) 40 MG tablet TAKE 1 TABLET (40 MG TOTAL) BY MOUTH DAILY 90 tablet 3  . Cyanocobalamin (B-12 PO) Take 1 tablet by mouth daily.    Marland Kitchen escitalopram (LEXAPRO) 20 MG tablet Take 1 tablet (20 mg total) by mouth daily. 90 tablet 3  . esomeprazole (NEXIUM) 40 MG capsule Take 1 capsule (40 mg total) by mouth daily before breakfast. 90 capsule 3  . memantine (NAMENDA) 10 MG tablet TAKE 1 TABLET BY MOUTH 2 TIMES DAILY. 180 tablet 1  . Multiple Vitamin (MULTIVITAMIN) capsule Take 1 capsule by mouth daily.      . nitroGLYCERIN (NITROSTAT) 0.4 MG SL tablet Place 1 tablet (0.4 mg total) under the tongue every 5 (five) minutes as needed. 25 tablet 0  . propranolol (INDERAL) 10 MG tablet TAKE 1 TABLET (10 MG TOTAL) BY MOUTH DAILY AT SUPPER 90 tablet 3  . Tamsulosin HCl (FLOMAX) 0.4 MG CAPS Take 0.4 mg by mouth daily.      Marland Kitchen gabapentin (NEURONTIN) 300 MG capsule 1  qam   2  @ 11:00 am  3  q  4:00 PM (Patient not taking: Reported on 01/06/2018) 150 capsule 2  . hydrOXYzine (VISTARIL) 50 MG capsule 1  qday for agitation  PRN (Patient not taking: Reported on 01/06/2018) 20 capsule 3  .  traZODone (DESYREL) 50 MG tablet 1  qhs  Prn  May repeat 60 tablet 5   No current facility-administered medications for this visit.  Neurologic: Headache: No Seizure: No Paresthesias:No  Musculoskeletal: Strength & Muscle Tone: within normal limits Gait & Station: normal Patient leans: N/A  Psychiatric Specialty Exam: ROS  Blood pressure (!) 147/69, pulse 62, temperature 97.6 F (36.4 C), height 5\' 8"  (1.727 m), weight 176 lb (79.8 kg).Body mass index is 26.76 kg/m.  General Appearance: Fairly Groomed  Eye Contact:  Good  Speech:  Normal Rate  Volume:  Normal  Mood:  Negative  Affect:  Congruent  Thought Process:  Coherent  Orientation:  NA  Thought Content:  WDL  Suicidal Thoughts:  No  Homicidal Thoughts:  No  Memory:  Recent;   Poor  Judgement:  Fair  Insight:  Lacking  Psychomotor Activity:  Normal  Concentration:    Recall:  Poor  Fund of Knowledge:Fair  Language: Good  Akathisia:  No  Handed:    AIMS (if indicated):    Assets:  Desire for Improvement  ADL's:  Impaired  Cognition: Impaired,  Moderate  Sleep:      Treatment Plan Summary: 3/25/20202:15 PM   Today's a scheduled 15-minute visit that went on for a half an hour.  Today her recommendations were to increase the Xanax to taking 0.25 mg 1 in the morning 2 at noon 2 at suppertime and 1 as needed.  He was also given a prescription for trazodone 50 mg to take at night for sleep.  He can repeat the trazodone.  His #1 problem is Alzheimer's dementia with behavioral disturbance.  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  His illness began at age 63 when he had bypass surgery.  This certainly seems to be inconsistent.  At her next visit we will revisit his diagnosis.  Nonetheless he is slowly progressive over the last few years getting worse and worse.  Still amazingly his wife who has great patient's is able to redirect him and spends all day with him.  This patient to return to see me in 1  month.

## 2018-06-13 ENCOUNTER — Ambulatory Visit: Payer: 59 | Admitting: Psychiatry

## 2018-06-22 DIAGNOSIS — F0391 Unspecified dementia with behavioral disturbance: Secondary | ICD-10-CM | POA: Diagnosis not present

## 2018-06-22 DIAGNOSIS — R456 Violent behavior: Secondary | ICD-10-CM | POA: Diagnosis not present

## 2018-06-23 DIAGNOSIS — J45909 Unspecified asthma, uncomplicated: Secondary | ICD-10-CM | POA: Diagnosis not present

## 2018-06-23 DIAGNOSIS — J45998 Other asthma: Secondary | ICD-10-CM | POA: Diagnosis not present

## 2018-06-23 DIAGNOSIS — I2510 Atherosclerotic heart disease of native coronary artery without angina pectoris: Secondary | ICD-10-CM | POA: Diagnosis not present

## 2018-06-23 DIAGNOSIS — Z961 Presence of intraocular lens: Secondary | ICD-10-CM | POA: Diagnosis not present

## 2018-06-23 DIAGNOSIS — R454 Irritability and anger: Secondary | ICD-10-CM | POA: Diagnosis not present

## 2018-06-23 DIAGNOSIS — K219 Gastro-esophageal reflux disease without esophagitis: Secondary | ICD-10-CM | POA: Diagnosis not present

## 2018-06-23 DIAGNOSIS — F0281 Dementia in other diseases classified elsewhere with behavioral disturbance: Secondary | ICD-10-CM | POA: Diagnosis not present

## 2018-06-23 DIAGNOSIS — N390 Urinary tract infection, site not specified: Secondary | ICD-10-CM | POA: Diagnosis not present

## 2018-06-23 DIAGNOSIS — F0391 Unspecified dementia with behavioral disturbance: Secondary | ICD-10-CM | POA: Diagnosis not present

## 2018-06-23 DIAGNOSIS — R001 Bradycardia, unspecified: Secondary | ICD-10-CM | POA: Diagnosis not present

## 2018-06-23 DIAGNOSIS — R6889 Other general symptoms and signs: Secondary | ICD-10-CM | POA: Diagnosis not present

## 2018-06-23 DIAGNOSIS — R609 Edema, unspecified: Secondary | ICD-10-CM | POA: Diagnosis not present

## 2018-06-23 DIAGNOSIS — F22 Delusional disorders: Secondary | ICD-10-CM | POA: Diagnosis not present

## 2018-06-23 DIAGNOSIS — Z79899 Other long term (current) drug therapy: Secondary | ICD-10-CM | POA: Diagnosis not present

## 2018-06-23 DIAGNOSIS — E785 Hyperlipidemia, unspecified: Secondary | ICD-10-CM | POA: Diagnosis not present

## 2018-06-23 DIAGNOSIS — R600 Localized edema: Secondary | ICD-10-CM | POA: Diagnosis not present

## 2018-06-23 DIAGNOSIS — Z03818 Encounter for observation for suspected exposure to other biological agents ruled out: Secondary | ICD-10-CM | POA: Diagnosis not present

## 2018-06-23 DIAGNOSIS — G4700 Insomnia, unspecified: Secondary | ICD-10-CM | POA: Diagnosis not present

## 2018-06-23 DIAGNOSIS — I10 Essential (primary) hypertension: Secondary | ICD-10-CM | POA: Diagnosis not present

## 2018-06-23 DIAGNOSIS — F29 Unspecified psychosis not due to a substance or known physiological condition: Secondary | ICD-10-CM | POA: Diagnosis not present

## 2018-06-23 DIAGNOSIS — G301 Alzheimer's disease with late onset: Secondary | ICD-10-CM | POA: Diagnosis not present

## 2018-06-23 DIAGNOSIS — R456 Violent behavior: Secondary | ICD-10-CM | POA: Diagnosis not present

## 2018-06-23 DIAGNOSIS — E559 Vitamin D deficiency, unspecified: Secondary | ICD-10-CM | POA: Diagnosis not present

## 2018-06-23 DIAGNOSIS — Z5181 Encounter for therapeutic drug level monitoring: Secondary | ICD-10-CM | POA: Diagnosis not present

## 2018-06-23 DIAGNOSIS — Z1159 Encounter for screening for other viral diseases: Secondary | ICD-10-CM | POA: Diagnosis not present

## 2018-06-23 DIAGNOSIS — R4182 Altered mental status, unspecified: Secondary | ICD-10-CM | POA: Diagnosis not present

## 2018-07-07 ENCOUNTER — Ambulatory Visit (HOSPITAL_COMMUNITY): Payer: 59 | Admitting: Psychiatry

## 2018-07-13 DIAGNOSIS — F0281 Dementia in other diseases classified elsewhere with behavioral disturbance: Secondary | ICD-10-CM | POA: Diagnosis not present

## 2018-07-13 DIAGNOSIS — Z79899 Other long term (current) drug therapy: Secondary | ICD-10-CM | POA: Diagnosis not present

## 2018-07-13 DIAGNOSIS — R4182 Altered mental status, unspecified: Secondary | ICD-10-CM | POA: Diagnosis not present

## 2018-07-13 DIAGNOSIS — G301 Alzheimer's disease with late onset: Secondary | ICD-10-CM | POA: Diagnosis not present

## 2018-07-13 DIAGNOSIS — F22 Delusional disorders: Secondary | ICD-10-CM | POA: Diagnosis not present

## 2018-07-13 DIAGNOSIS — Z5181 Encounter for therapeutic drug level monitoring: Secondary | ICD-10-CM | POA: Diagnosis not present

## 2018-07-14 ENCOUNTER — Ambulatory Visit: Payer: 59 | Admitting: Psychiatry

## 2018-08-03 DIAGNOSIS — I10 Essential (primary) hypertension: Secondary | ICD-10-CM | POA: Diagnosis not present

## 2018-08-03 DIAGNOSIS — F0281 Dementia in other diseases classified elsewhere with behavioral disturbance: Secondary | ICD-10-CM | POA: Diagnosis not present

## 2018-08-09 MED FILL — ESOMEPRAZOLE MAG DR 40 MG C: 40 | 90 days supply | Qty: 90 | Fill #1

## 2018-08-11 DIAGNOSIS — I2510 Atherosclerotic heart disease of native coronary artery without angina pectoris: Secondary | ICD-10-CM | POA: Diagnosis not present

## 2018-08-11 DIAGNOSIS — E7849 Other hyperlipidemia: Secondary | ICD-10-CM | POA: Diagnosis not present

## 2018-08-11 DIAGNOSIS — I10 Essential (primary) hypertension: Secondary | ICD-10-CM | POA: Diagnosis not present

## 2018-08-11 DIAGNOSIS — E559 Vitamin D deficiency, unspecified: Secondary | ICD-10-CM | POA: Diagnosis not present

## 2018-08-11 DIAGNOSIS — D518 Other vitamin B12 deficiency anemias: Secondary | ICD-10-CM | POA: Diagnosis not present

## 2018-08-11 DIAGNOSIS — Z79899 Other long term (current) drug therapy: Secondary | ICD-10-CM | POA: Diagnosis not present

## 2018-08-11 DIAGNOSIS — E119 Type 2 diabetes mellitus without complications: Secondary | ICD-10-CM | POA: Diagnosis not present

## 2018-08-11 DIAGNOSIS — E038 Other specified hypothyroidism: Secondary | ICD-10-CM | POA: Diagnosis not present

## 2018-08-11 DIAGNOSIS — F0391 Unspecified dementia with behavioral disturbance: Secondary | ICD-10-CM | POA: Diagnosis not present

## 2018-08-11 DIAGNOSIS — J45909 Unspecified asthma, uncomplicated: Secondary | ICD-10-CM | POA: Diagnosis not present

## 2018-08-17 DIAGNOSIS — N390 Urinary tract infection, site not specified: Secondary | ICD-10-CM | POA: Diagnosis not present

## 2018-08-22 DIAGNOSIS — R600 Localized edema: Secondary | ICD-10-CM | POA: Diagnosis not present

## 2018-08-26 ENCOUNTER — Ambulatory Visit: Payer: 59 | Admitting: Neurology

## 2018-08-30 DIAGNOSIS — R451 Restlessness and agitation: Secondary | ICD-10-CM | POA: Diagnosis not present

## 2018-08-30 DIAGNOSIS — F419 Anxiety disorder, unspecified: Secondary | ICD-10-CM | POA: Diagnosis not present

## 2018-08-30 DIAGNOSIS — F0391 Unspecified dementia with behavioral disturbance: Secondary | ICD-10-CM | POA: Diagnosis not present

## 2018-08-31 ENCOUNTER — Telehealth: Payer: Self-pay

## 2018-08-31 NOTE — Telephone Encounter (Signed)
Spoke with patient's wife [name] to schedule overdue F/U with Dr. Fletcher Anon. Wife states Marcellas has been placed in a nursing home due to worsening dementia and is under the care of the doctor at the facility. She is not able to go see him at this time due to [place] 19 precautions so she declined a telehealth visit. She also states she will no longer be able to bring him into the office for visits and wanted to make Dr. Fletcher Anon aware of this. Recall deleted.

## 2018-09-06 ENCOUNTER — Emergency Department (HOSPITAL_COMMUNITY)
Admission: EM | Admit: 2018-09-06 | Discharge: 2018-09-06 | Disposition: A | Discharge status: Home / Self Care | Payer: 59 | Attending: Emergency Medicine | Admitting: Emergency Medicine

## 2018-09-06 ENCOUNTER — Emergency Department (HOSPITAL_COMMUNITY): Payer: 59

## 2018-09-06 DIAGNOSIS — Y9389 Activity, other specified: Secondary | ICD-10-CM | POA: Diagnosis not present

## 2018-09-06 DIAGNOSIS — S199XXA Unspecified injury of neck, initial encounter: Secondary | ICD-10-CM | POA: Diagnosis not present

## 2018-09-06 DIAGNOSIS — R609 Edema, unspecified: Secondary | ICD-10-CM | POA: Diagnosis not present

## 2018-09-06 DIAGNOSIS — R0902 Hypoxemia: Secondary | ICD-10-CM | POA: Diagnosis not present

## 2018-09-06 DIAGNOSIS — W01198A Fall on same level from slipping, tripping and stumbling with subsequent striking against other object, initial encounter: Secondary | ICD-10-CM | POA: Insufficient documentation

## 2018-09-06 DIAGNOSIS — Y998 Other external cause status: Secondary | ICD-10-CM | POA: Insufficient documentation

## 2018-09-06 DIAGNOSIS — Z7401 Bed confinement status: Secondary | ICD-10-CM | POA: Diagnosis not present

## 2018-09-06 DIAGNOSIS — Z7982 Long term (current) use of aspirin: Secondary | ICD-10-CM | POA: Insufficient documentation

## 2018-09-06 DIAGNOSIS — F0390 Unspecified dementia without behavioral disturbance: Secondary | ICD-10-CM | POA: Diagnosis not present

## 2018-09-06 DIAGNOSIS — M2550 Pain in unspecified joint: Secondary | ICD-10-CM | POA: Diagnosis not present

## 2018-09-06 DIAGNOSIS — S0990XA Unspecified injury of head, initial encounter: Secondary | ICD-10-CM | POA: Insufficient documentation

## 2018-09-06 DIAGNOSIS — Z79899 Other long term (current) drug therapy: Secondary | ICD-10-CM | POA: Diagnosis not present

## 2018-09-06 DIAGNOSIS — G309 Alzheimer's disease, unspecified: Secondary | ICD-10-CM | POA: Diagnosis not present

## 2018-09-06 DIAGNOSIS — W19XXXA Unspecified fall, initial encounter: Secondary | ICD-10-CM | POA: Diagnosis not present

## 2018-09-06 DIAGNOSIS — R5381 Other malaise: Secondary | ICD-10-CM | POA: Diagnosis not present

## 2018-09-06 DIAGNOSIS — Y92128 Other place in nursing home as the place of occurrence of the external cause: Secondary | ICD-10-CM | POA: Insufficient documentation

## 2018-09-06 NOTE — ED Notes (Signed)
Pt removed his briefs. Pt remains confused and not following commands.

## 2018-09-06 NOTE — ED Notes (Addendum)
NURSE NAVIGATOR  Per pts family pt is a resident of [facility] in [place], has been there about a month after becoming more confused and confrontational and combative at home and wife had a more difficult time with him , pts wife states that she knew this ER visit would throw him for a loop, sh cannot come and sit with him

## 2018-09-06 NOTE — ED Notes (Signed)
[facility] called for transport.  

## 2018-09-06 NOTE — ED Notes (Signed)
Spoke with pts wife

## 2018-09-06 NOTE — ED Triage Notes (Signed)
Fall backwards from standing position after attempting to pull locked door open. NOTE IN PROGRESS

## 2018-09-06 NOTE — ED Provider Notes (Addendum)
[facility] EMERGENCY DEPARTMENT Provider Note   CSN: [id number] Arrival date & time: 09/06/18  1530    History   Chief Complaint Chief Complaint  Patient presents with   Fall    HPI [name] is a 78 y.o. male.     HPI  He presents for evaluation of injury to head, when he was trying to open a locked door, lost his grip and fell backwards striking his head.  Is unclear if he got up or walked afterwards.  He presents by EMS for evaluation of the injury, from his memory care unit.  Level 5 caveat-dementia  Past Medical History:  Diagnosis Date   Carotid artery disease ([place])    on the right side -60-79%   Coronary artery disease    prior bypass grafting. Most recent Myoview 2011 demonstrated no ischemia and normal left ventricular function   Heart murmur    Hyperlipidemia    Memory loss    Sleep apnea    noncompliant with CPAP   Syncope and collapse    No arrhythmia on loop recorder. Likely orthostatic hypotension    Patient Active Problem List   Diagnosis Date Noted   Alzheimer's disease ([place]) 12/10/2011   Carotid artery disease ([place])    Hyperlipidemia    SYNCOPE 08/08/2008   CAD, ARTERY BYPASS GRAFT 05/28/2008   PALPITATIONS 05/28/2008    Past Surgical History:  Procedure Laterality Date   APPENDECTOMY     CARDIAC CATHETERIZATION  2010   patent LIMA to LAD, SVG to D1, SVG to OM3, occluded SVG to a small D2. Normal EF   CORONARY ARTERY BYPASS GRAFT  2006   HAND SURGERY          Home Medications    Prior to Admission medications   Medication Sig Start Date End Date Taking? Authorizing Provider  ALPRAZolam Duanne Moron) 0.25 MG tablet 1  qam   2  q noon   2  q diner  1  Prn  qday 06/08/18   Plovsky, Berneta Sages, MD  aspirin 325 MG EC tablet Take 325 mg by mouth daily.      [provider]  atorvastatin (LIPITOR) 40 MG tablet TAKE 1 TABLET (40 MG TOTAL) BY MOUTH DAILY 07/09/17   Wellington Hampshire, MD    Cyanocobalamin (B-12 PO) Take 1 tablet by mouth daily.    [provider]  escitalopram (LEXAPRO) 20 MG tablet Take 1 tablet (20 mg total) by mouth daily. 10/25/12   Tat, Eustace Quail, DO  esomeprazole (NEXIUM) 40 MG capsule Take 1 capsule (40 mg total) by mouth daily before breakfast. 05/16/13   Wellington Hampshire, MD  gabapentin (NEURONTIN) 300 MG capsule 1  qam   2  @ 11:00 am  3  q  4:00 PM Patient not taking: Reported on 01/06/2018 11/04/17   Norma Fredrickson, MD  hydrOXYzine (VISTARIL) 50 MG capsule 1  qday for agitation  PRN Patient not taking: Reported on 01/06/2018 11/04/17   Norma Fredrickson, MD  memantine (NAMENDA) 10 MG tablet TAKE 1 TABLET BY MOUTH 2 TIMES DAILY. 10/06/17   TatEustace Quail, DO  Multiple Vitamin (MULTIVITAMIN) capsule Take 1 capsule by mouth daily.      [provider]  nitroGLYCERIN (NITROSTAT) 0.4 MG SL tablet Place 1 tablet (0.4 mg total) under the tongue every 5 (five) minutes as needed. 05/03/18   Minna Merritts, MD  propranolol (INDERAL) 10 MG tablet TAKE 1 TABLET (10 MG TOTAL) BY  MOUTH DAILY AT SUPPER 07/09/17   Wellington Hampshire, MD  Tamsulosin HCl (FLOMAX) 0.4 MG CAPS Take 0.4 mg by mouth daily.      [provider]  traZODone (DESYREL) 50 MG tablet 1  qhs  Prn  May repeat 06/08/18   Norma Fredrickson, MD  tadalafil (CIALIS) 10 MG tablet Take 10 mg by mouth daily as needed.    06/05/11  [provider]    Family History Family History  Family history unknown: Yes    Social History Social History   Tobacco Use   Smoking status: Never Smoker   Smokeless tobacco: Never Used  Substance Use Topics   Alcohol use: No   Drug use: No     Allergies   Captopril, Methyldopa, and Metoprolol   Review of Systems Review of Systems  Unable to perform ROS: Dementia     Physical Exam Updated Vital Signs BP (!) 143/90 (BP Location: Right Arm)    Pulse 68    Temp 98 F (36.7 C) (Oral)    Resp 16    SpO2 98%   Physical  Exam Vitals signs and nursing note reviewed.  Constitutional:      General: He is not in acute distress.    Appearance: He is well-developed. He is not ill-appearing, toxic-appearing or diaphoretic.  HENT:     Head: Normocephalic.     Comments: Small contusion mid occiput, without bleeding, abrasion or crepitation.    Right Ear: External ear normal.     Left Ear: External ear normal.     Nose: No congestion or rhinorrhea.     Mouth/Throat:     Mouth: Mucous membranes are moist.     Pharynx: No oropharyngeal exudate or posterior oropharyngeal erythema.  Eyes:     Extraocular Movements: Extraocular movements intact.     Conjunctiva/sclera: Conjunctivae normal.     Pupils: Pupils are equal, round, and reactive to light.  Neck:     Musculoskeletal: Normal range of motion and neck supple.     Trachea: Phonation normal.  Cardiovascular:     Rate and Rhythm: Normal rate and regular rhythm.     Heart sounds: Normal heart sounds.  Pulmonary:     Effort: Pulmonary effort is normal.     Breath sounds: Normal breath sounds.  Abdominal:     Palpations: Abdomen is soft.     Tenderness: There is no abdominal tenderness.  Musculoskeletal: Normal range of motion.        General: No swelling, tenderness or signs of injury.  Skin:    General: Skin is warm and dry.  Neurological:     Mental Status: He is alert.     Cranial Nerves: No cranial nerve deficit.     Motor: No abnormal muscle tone.     Coordination: Coordination normal.     Comments: Confused.  No dysarthria.  Moves arms and legs equally.  No gross asymmetry of strength.  Psychiatric:        Mood and Affect: Mood normal.        Behavior: Behavior normal.      ED Treatments / Results  Labs (all labs ordered are listed, but only abnormal results are displayed) Labs Reviewed - No data to display  EKG None  Radiology Ct Head Wo Contrast  Result Date: 09/06/2018 CLINICAL DATA:  Fall, trauma EXAM: CT HEAD WITHOUT CONTRAST CT  CERVICAL SPINE WITHOUT CONTRAST TECHNIQUE: Multidetector CT imaging of the head and cervical spine was performed  following the standard protocol without intravenous contrast. Multiplanar CT image reconstructions of the cervical spine were also generated. COMPARISON:  05/27/2011 FINDINGS: CT HEAD FINDINGS Brain: No evidence of acute infarction, hemorrhage, hydrocephalus, extra-axial collection or mass lesion/mass effect. Mild periventricular white matter hypodensity and global volume loss. Vascular: No hyperdense vessel or unexpected calcification. Skull: Normal. Negative for fracture or focal lesion. Sinuses/Orbits: No acute finding. Other: None. CT CERVICAL SPINE FINDINGS Alignment: Normal. Skull base and vertebrae: Osteopenia. There is a subtle superior endplate deformity of the C7 vertebral body (series 6, image 29). No primary bone lesion or focal pathologic process. Soft tissues and spinal canal: No prevertebral fluid or swelling. No visible canal hematoma. Disc levels: Multilevel disc degenerative disease, moderate at C4 through C6. Upper chest: Negative. Other: None. IMPRESSION: 1. No acute intracranial pathology. Mild small-vessel white matter disease. 2. Osteopenia. There is a subtle superior endplate deformity of the C7 vertebral body (series 6, image 29). Correlate for acute pain and point tenderness. MRI may be used to evaluate for marrow edema and fracture acuity if desired. 3.  No other fracture or static subluxation of the cervical spine. Electronically Signed   By: Eddie Candle M.D.   On: 09/06/2018 17:47   Ct Cervical Spine Wo Contrast  Result Date: 09/06/2018 CLINICAL DATA:  Fall, trauma EXAM: CT HEAD WITHOUT CONTRAST CT CERVICAL SPINE WITHOUT CONTRAST TECHNIQUE: Multidetector CT imaging of the head and cervical spine was performed following the standard protocol without intravenous contrast. Multiplanar CT image reconstructions of the cervical spine were also generated. COMPARISON:  05/27/2011  FINDINGS: CT HEAD FINDINGS Brain: No evidence of acute infarction, hemorrhage, hydrocephalus, extra-axial collection or mass lesion/mass effect. Mild periventricular white matter hypodensity and global volume loss. Vascular: No hyperdense vessel or unexpected calcification. Skull: Normal. Negative for fracture or focal lesion. Sinuses/Orbits: No acute finding. Other: None. CT CERVICAL SPINE FINDINGS Alignment: Normal. Skull base and vertebrae: Osteopenia. There is a subtle superior endplate deformity of the C7 vertebral body (series 6, image 29). No primary bone lesion or focal pathologic process. Soft tissues and spinal canal: No prevertebral fluid or swelling. No visible canal hematoma. Disc levels: Multilevel disc degenerative disease, moderate at C4 through C6. Upper chest: Negative. Other: None. IMPRESSION: 1. No acute intracranial pathology. Mild small-vessel white matter disease. 2. Osteopenia. There is a subtle superior endplate deformity of the C7 vertebral body (series 6, image 29). Correlate for acute pain and point tenderness. MRI may be used to evaluate for marrow edema and fracture acuity if desired. 3.  No other fracture or static subluxation of the cervical spine. Electronically Signed   By: Eddie Candle M.D.   On: 09/06/2018 17:47    Procedures Procedures (including critical care time)  Medications Ordered in ED Medications - No data to display   Initial Impression / Assessment and Plan / ED Course  I have reviewed the triage vital signs and the nursing notes.  Pertinent labs & imaging results that were available during my care of the patient were reviewed by me and considered in my medical decision making (see chart for details).  Clinical Course as of Sep 06 1843  Tue Sep 06, 2018  1844 I discussed the findings with his wife [name] who was appreciative, and understands all the implications.  All questions were answered   [EW]  1844 CT images reviewed by me no clear evidence for  acute fracture or intracranial injury.  Possible lower cervical fracture however the patient was asymptomatic for it.  There is no indication for further evaluation or intervention at this time.   [EW]    Clinical Course User Index [EW] Daleen Bo, MD        Patient Vitals for the past 24 hrs:  BP Temp Temp src Pulse Resp SpO2  09/06/18 1530 (!) 143/90 98 F (36.7 C) Oral 68 16 98 %    6:41 PM Reevaluation with update and discussion. After initial assessment and treatment, an updated evaluation reveals no change in clinical status.Daleen Bo   Medical Decision Making: Fall with apparent head injury, by report.  Patient appears to be at his baseline.  No indication for further evaluation or intervention at this time.  Subtle MRI abnormality which is nonspecific which can be worked up further as an outpatient if he has continued pain in his neck.  The patient is not currently complaining of pain however his dementia makes full evaluation clinically difficult.  I doubt serious injury, or spinal myelopathy.  CRITICAL CARE-no Performed by: Daleen Bo  Nursing Notes Reviewed/ Care Coordinated Applicable Imaging Reviewed Interpretation of Laboratory Data incorporated into ED treatment  The patient appears reasonably screened and/or stabilized for discharge and I doubt any other medical condition or other [facility] requiring further screening, evaluation, or treatment in the ED at this time prior to discharge.  Plan: Home Medications-to new usual, use Tylenol for pain; Home Treatments-patient advance activity as tolerated; return here if the recommended treatment, does not improve the symptoms; Recommended follow up-PCP follow-up PRN   Final Clinical Impressions(s) / ED Diagnoses   Final diagnoses:  Fall, initial encounter    ED Discharge Orders    None       Daleen Bo, MD 09/06/18 1844    Daleen Bo, MD 09/06/18 [phone]

## 2018-09-06 NOTE — Discharge Instructions (Addendum)
There were no serious injuries found from the fall today.  If he continues to complain of pain in his lower neck, that can be evaluated further with an MRI to look for problems that were not completely diagnosed by CT images which were done today.  Primary care doctor could help him with further testing and treatment.  If he needs something for pain, it is safe to use Tylenol every 4 hours.  Return here, as needed, for problems.

## 2018-09-06 NOTE — ED Notes (Addendum)
Pt continues to attempt to get up from bed. Pt was attempting to place fingers into power outlet. Bed distanced from the wall to prevent injury.

## 2018-09-06 NOTE — ED Notes (Signed)
Pt is highly confused. Refusing to follow commands and continuously attempting to get up from bed. Activity vest ordered.

## 2018-09-06 NOTE — ED Notes (Signed)
There is a Actuary order

## 2018-09-26 DIAGNOSIS — R400 Somnolence: Secondary | ICD-10-CM | POA: Diagnosis not present

## 2018-09-26 DIAGNOSIS — F0391 Unspecified dementia with behavioral disturbance: Secondary | ICD-10-CM | POA: Diagnosis not present

## 2018-09-27 DIAGNOSIS — M2042 Other hammer toe(s) (acquired), left foot: Secondary | ICD-10-CM | POA: Diagnosis not present

## 2018-09-27 DIAGNOSIS — I739 Peripheral vascular disease, unspecified: Secondary | ICD-10-CM | POA: Diagnosis not present

## 2018-09-27 DIAGNOSIS — B351 Tinea unguium: Secondary | ICD-10-CM | POA: Diagnosis not present

## 2018-09-29 DIAGNOSIS — E559 Vitamin D deficiency, unspecified: Secondary | ICD-10-CM | POA: Diagnosis not present

## 2018-09-29 DIAGNOSIS — E119 Type 2 diabetes mellitus without complications: Secondary | ICD-10-CM | POA: Diagnosis not present

## 2018-09-29 DIAGNOSIS — D518 Other vitamin B12 deficiency anemias: Secondary | ICD-10-CM | POA: Diagnosis not present

## 2018-09-29 DIAGNOSIS — Z79899 Other long term (current) drug therapy: Secondary | ICD-10-CM | POA: Diagnosis not present

## 2018-09-29 DIAGNOSIS — E038 Other specified hypothyroidism: Secondary | ICD-10-CM | POA: Diagnosis not present

## 2018-09-29 DIAGNOSIS — E7849 Other hyperlipidemia: Secondary | ICD-10-CM | POA: Diagnosis not present

## 2018-10-03 DIAGNOSIS — F0391 Unspecified dementia with behavioral disturbance: Secondary | ICD-10-CM | POA: Diagnosis not present

## 2018-10-03 DIAGNOSIS — R296 Repeated falls: Secondary | ICD-10-CM | POA: Diagnosis not present

## 2018-10-03 DIAGNOSIS — R2681 Unsteadiness on feet: Secondary | ICD-10-CM | POA: Diagnosis not present

## 2018-10-07 DIAGNOSIS — I10 Essential (primary) hypertension: Secondary | ICD-10-CM | POA: Diagnosis not present

## 2018-10-07 DIAGNOSIS — R32 Unspecified urinary incontinence: Secondary | ICD-10-CM | POA: Diagnosis not present

## 2018-10-07 DIAGNOSIS — F0390 Unspecified dementia without behavioral disturbance: Secondary | ICD-10-CM | POA: Diagnosis not present

## 2018-10-07 DIAGNOSIS — I2510 Atherosclerotic heart disease of native coronary artery without angina pectoris: Secondary | ICD-10-CM | POA: Diagnosis not present

## 2018-10-07 DIAGNOSIS — Z79899 Other long term (current) drug therapy: Secondary | ICD-10-CM | POA: Diagnosis not present

## 2018-10-07 DIAGNOSIS — Z9181 History of falling: Secondary | ICD-10-CM | POA: Diagnosis not present

## 2018-10-07 DIAGNOSIS — Z7982 Long term (current) use of aspirin: Secondary | ICD-10-CM | POA: Diagnosis not present

## 2018-10-12 DIAGNOSIS — Z7982 Long term (current) use of aspirin: Secondary | ICD-10-CM | POA: Diagnosis not present

## 2018-10-12 DIAGNOSIS — F0391 Unspecified dementia with behavioral disturbance: Secondary | ICD-10-CM | POA: Diagnosis not present

## 2018-10-12 DIAGNOSIS — R451 Restlessness and agitation: Secondary | ICD-10-CM | POA: Diagnosis not present

## 2018-10-12 DIAGNOSIS — R32 Unspecified urinary incontinence: Secondary | ICD-10-CM | POA: Diagnosis not present

## 2018-10-12 DIAGNOSIS — Z79899 Other long term (current) drug therapy: Secondary | ICD-10-CM | POA: Diagnosis not present

## 2018-10-12 DIAGNOSIS — Z9181 History of falling: Secondary | ICD-10-CM | POA: Diagnosis not present

## 2018-10-12 DIAGNOSIS — I10 Essential (primary) hypertension: Secondary | ICD-10-CM | POA: Diagnosis not present

## 2018-10-12 DIAGNOSIS — F0390 Unspecified dementia without behavioral disturbance: Secondary | ICD-10-CM | POA: Diagnosis not present

## 2018-10-12 DIAGNOSIS — F419 Anxiety disorder, unspecified: Secondary | ICD-10-CM | POA: Diagnosis not present

## 2018-10-12 DIAGNOSIS — I2510 Atherosclerotic heart disease of native coronary artery without angina pectoris: Secondary | ICD-10-CM | POA: Diagnosis not present

## 2018-10-18 DIAGNOSIS — Z79899 Other long term (current) drug therapy: Secondary | ICD-10-CM | POA: Diagnosis not present

## 2018-10-18 DIAGNOSIS — F0390 Unspecified dementia without behavioral disturbance: Secondary | ICD-10-CM | POA: Diagnosis not present

## 2018-10-18 DIAGNOSIS — I10 Essential (primary) hypertension: Secondary | ICD-10-CM | POA: Diagnosis not present

## 2018-10-18 DIAGNOSIS — I2510 Atherosclerotic heart disease of native coronary artery without angina pectoris: Secondary | ICD-10-CM | POA: Diagnosis not present

## 2018-10-18 DIAGNOSIS — Z7982 Long term (current) use of aspirin: Secondary | ICD-10-CM | POA: Diagnosis not present

## 2018-10-18 DIAGNOSIS — R32 Unspecified urinary incontinence: Secondary | ICD-10-CM | POA: Diagnosis not present

## 2018-10-18 DIAGNOSIS — Z9181 History of falling: Secondary | ICD-10-CM | POA: Diagnosis not present

## 2018-10-19 DIAGNOSIS — Z79899 Other long term (current) drug therapy: Secondary | ICD-10-CM | POA: Diagnosis not present

## 2018-10-19 DIAGNOSIS — I2510 Atherosclerotic heart disease of native coronary artery without angina pectoris: Secondary | ICD-10-CM | POA: Diagnosis not present

## 2018-10-19 DIAGNOSIS — Z9181 History of falling: Secondary | ICD-10-CM | POA: Diagnosis not present

## 2018-10-19 DIAGNOSIS — Z7982 Long term (current) use of aspirin: Secondary | ICD-10-CM | POA: Diagnosis not present

## 2018-10-19 DIAGNOSIS — R32 Unspecified urinary incontinence: Secondary | ICD-10-CM | POA: Diagnosis not present

## 2018-10-19 DIAGNOSIS — F0390 Unspecified dementia without behavioral disturbance: Secondary | ICD-10-CM | POA: Diagnosis not present

## 2018-10-19 DIAGNOSIS — I10 Essential (primary) hypertension: Secondary | ICD-10-CM | POA: Diagnosis not present

## 2018-10-20 MED FILL — ESOMEPRAZOLE MAG DR 40 MG C: 40 | 90 days supply | Qty: 90 | Fill #0

## 2018-10-24 DIAGNOSIS — F0390 Unspecified dementia without behavioral disturbance: Secondary | ICD-10-CM | POA: Diagnosis not present

## 2018-10-24 DIAGNOSIS — Z79899 Other long term (current) drug therapy: Secondary | ICD-10-CM | POA: Diagnosis not present

## 2018-10-24 DIAGNOSIS — I2510 Atherosclerotic heart disease of native coronary artery without angina pectoris: Secondary | ICD-10-CM | POA: Diagnosis not present

## 2018-10-24 DIAGNOSIS — I10 Essential (primary) hypertension: Secondary | ICD-10-CM | POA: Diagnosis not present

## 2018-10-24 DIAGNOSIS — R32 Unspecified urinary incontinence: Secondary | ICD-10-CM | POA: Diagnosis not present

## 2018-10-24 DIAGNOSIS — Z9181 History of falling: Secondary | ICD-10-CM | POA: Diagnosis not present

## 2018-10-24 DIAGNOSIS — N400 Enlarged prostate without lower urinary tract symptoms: Secondary | ICD-10-CM | POA: Diagnosis not present

## 2018-10-24 DIAGNOSIS — Z7982 Long term (current) use of aspirin: Secondary | ICD-10-CM | POA: Diagnosis not present

## 2018-10-24 DIAGNOSIS — E039 Hypothyroidism, unspecified: Secondary | ICD-10-CM | POA: Diagnosis not present

## 2018-10-26 DIAGNOSIS — R32 Unspecified urinary incontinence: Secondary | ICD-10-CM | POA: Diagnosis not present

## 2018-10-26 DIAGNOSIS — I2510 Atherosclerotic heart disease of native coronary artery without angina pectoris: Secondary | ICD-10-CM | POA: Diagnosis not present

## 2018-10-26 DIAGNOSIS — Z79899 Other long term (current) drug therapy: Secondary | ICD-10-CM | POA: Diagnosis not present

## 2018-10-26 DIAGNOSIS — Z9181 History of falling: Secondary | ICD-10-CM | POA: Diagnosis not present

## 2018-10-26 DIAGNOSIS — Z7982 Long term (current) use of aspirin: Secondary | ICD-10-CM | POA: Diagnosis not present

## 2018-10-26 DIAGNOSIS — F0390 Unspecified dementia without behavioral disturbance: Secondary | ICD-10-CM | POA: Diagnosis not present

## 2018-10-26 DIAGNOSIS — I10 Essential (primary) hypertension: Secondary | ICD-10-CM | POA: Diagnosis not present

## 2018-11-02 DIAGNOSIS — Z7982 Long term (current) use of aspirin: Secondary | ICD-10-CM | POA: Diagnosis not present

## 2018-11-02 DIAGNOSIS — Z79899 Other long term (current) drug therapy: Secondary | ICD-10-CM | POA: Diagnosis not present

## 2018-11-02 DIAGNOSIS — R32 Unspecified urinary incontinence: Secondary | ICD-10-CM | POA: Diagnosis not present

## 2018-11-02 DIAGNOSIS — I10 Essential (primary) hypertension: Secondary | ICD-10-CM | POA: Diagnosis not present

## 2018-11-02 DIAGNOSIS — F0390 Unspecified dementia without behavioral disturbance: Secondary | ICD-10-CM | POA: Diagnosis not present

## 2018-11-02 DIAGNOSIS — Z9181 History of falling: Secondary | ICD-10-CM | POA: Diagnosis not present

## 2018-11-02 DIAGNOSIS — I2510 Atherosclerotic heart disease of native coronary artery without angina pectoris: Secondary | ICD-10-CM | POA: Diagnosis not present

## 2018-11-03 DIAGNOSIS — F419 Anxiety disorder, unspecified: Secondary | ICD-10-CM | POA: Diagnosis not present

## 2018-11-03 DIAGNOSIS — R451 Restlessness and agitation: Secondary | ICD-10-CM | POA: Diagnosis not present

## 2018-11-03 DIAGNOSIS — F0391 Unspecified dementia with behavioral disturbance: Secondary | ICD-10-CM | POA: Diagnosis not present

## 2018-11-07 MED FILL — risperiDONE 0.25 MG TABS: 0.25 | 90 days supply | Qty: 180 | Fill #0

## 2018-11-07 MED FILL — PANTOPRAZOLE SOD DR 20 MG T: 20 | 90 days supply | Qty: 90 | Fill #0

## 2018-11-07 MED FILL — DIVALPROEX SOD ER 500 MG TA: 500 | 90 days supply | Qty: 180 | Fill #0

## 2018-11-07 MED FILL — MELATONIN 3 MG TABS: 3 | 90 days supply | Qty: 180 | Fill #0

## 2018-11-07 MED FILL — TAMSULOSIN HCL 0.4 MG CAP: 0.4 | 90 days supply | Qty: 90 | Fill #0

## 2018-11-07 MED FILL — traZODone HCL 50 MG TABS: 50 | 90 days supply | Qty: 90 | Fill #0

## 2018-11-07 MED FILL — PROPRANOLOL 10 MG TABLET: 10 | 90 days supply | Qty: 90 | Fill #0

## 2018-11-07 MED FILL — ESCITALOPRAM 10 MG TABLET: 10 | 90 days supply | Qty: 180 | Fill #0

## 2018-11-07 MED FILL — CERTAVITE/ANTIOXIDANTS TABS: 130 days supply | Qty: 130 | Fill #0

## 2018-11-07 MED FILL — MEMANTINE HCL 10 MG TABS: 10 | 90 days supply | Qty: 180 | Fill #0

## 2018-11-07 MED FILL — FOLIC ACID 1 MG TABS: 1 | 90 days supply | Qty: 90 | Fill #0

## 2018-11-07 MED FILL — ASPIRIN EC 325 MG TBEC: 325 | 90 days supply | Qty: 90 | Fill #0

## 2018-11-07 MED FILL — LEVOTHYROXINE 25 MCG TABLET: 25 | 90 days supply | Qty: 90 | Fill #0

## 2018-11-18 MED FILL — valACYclovir HCL 1 GM TABS: 1 | 5 days supply | Qty: 15 | Fill #0

## 2018-11-23 DIAGNOSIS — Z79899 Other long term (current) drug therapy: Secondary | ICD-10-CM | POA: Diagnosis not present

## 2018-11-23 DIAGNOSIS — E038 Other specified hypothyroidism: Secondary | ICD-10-CM | POA: Diagnosis not present

## 2018-11-24 DIAGNOSIS — F419 Anxiety disorder, unspecified: Secondary | ICD-10-CM | POA: Diagnosis not present

## 2018-11-24 DIAGNOSIS — F0391 Unspecified dementia with behavioral disturbance: Secondary | ICD-10-CM | POA: Diagnosis not present

## 2018-11-24 DIAGNOSIS — R451 Restlessness and agitation: Secondary | ICD-10-CM | POA: Diagnosis not present

## 2018-11-28 DIAGNOSIS — J45909 Unspecified asthma, uncomplicated: Secondary | ICD-10-CM | POA: Diagnosis not present

## 2018-11-28 DIAGNOSIS — E039 Hypothyroidism, unspecified: Secondary | ICD-10-CM | POA: Diagnosis not present

## 2018-11-28 DIAGNOSIS — B029 Zoster without complications: Secondary | ICD-10-CM | POA: Diagnosis not present

## 2018-11-28 DIAGNOSIS — R2681 Unsteadiness on feet: Secondary | ICD-10-CM | POA: Diagnosis not present

## 2018-11-28 DIAGNOSIS — I2510 Atherosclerotic heart disease of native coronary artery without angina pectoris: Secondary | ICD-10-CM | POA: Diagnosis not present

## 2018-12-10 MED FILL — LEVOTHYROXINE 50 MCG TABLET: 50 | 90 days supply | Qty: 90 | Fill #0

## 2018-12-14 DIAGNOSIS — W19XXXA Unspecified fall, initial encounter: Secondary | ICD-10-CM | POA: Diagnosis not present

## 2018-12-14 DIAGNOSIS — F0280 Dementia in other diseases classified elsewhere without behavioral disturbance: Secondary | ICD-10-CM | POA: Diagnosis not present

## 2018-12-14 DIAGNOSIS — R5381 Other malaise: Secondary | ICD-10-CM | POA: Diagnosis not present

## 2018-12-14 DIAGNOSIS — Z7401 Bed confinement status: Secondary | ICD-10-CM | POA: Diagnosis not present

## 2018-12-14 DIAGNOSIS — G9340 Encephalopathy, unspecified: Secondary | ICD-10-CM | POA: Diagnosis not present

## 2018-12-14 DIAGNOSIS — R402342 Coma scale, best motor response, flexion withdrawal, at arrival to emergency department: Secondary | ICD-10-CM | POA: Diagnosis not present

## 2018-12-14 DIAGNOSIS — E785 Hyperlipidemia, unspecified: Secondary | ICD-10-CM | POA: Diagnosis not present

## 2018-12-14 DIAGNOSIS — E870 Hyperosmolality and hypernatremia: Secondary | ICD-10-CM | POA: Insufficient documentation

## 2018-12-14 DIAGNOSIS — E871 Hypo-osmolality and hyponatremia: Secondary | ICD-10-CM | POA: Diagnosis not present

## 2018-12-14 DIAGNOSIS — R4182 Altered mental status, unspecified: Secondary | ICD-10-CM | POA: Diagnosis not present

## 2018-12-14 DIAGNOSIS — Y998 Other external cause status: Secondary | ICD-10-CM | POA: Diagnosis not present

## 2018-12-14 DIAGNOSIS — S299XXA Unspecified injury of thorax, initial encounter: Secondary | ICD-10-CM | POA: Diagnosis not present

## 2018-12-14 DIAGNOSIS — E782 Mixed hyperlipidemia: Secondary | ICD-10-CM | POA: Diagnosis not present

## 2018-12-14 DIAGNOSIS — R404 Transient alteration of awareness: Secondary | ICD-10-CM | POA: Diagnosis not present

## 2018-12-14 DIAGNOSIS — S199XXA Unspecified injury of neck, initial encounter: Secondary | ICD-10-CM | POA: Diagnosis not present

## 2018-12-14 DIAGNOSIS — N179 Acute kidney failure, unspecified: Secondary | ICD-10-CM | POA: Diagnosis not present

## 2018-12-14 DIAGNOSIS — S0990XA Unspecified injury of head, initial encounter: Secondary | ICD-10-CM | POA: Diagnosis not present

## 2018-12-14 DIAGNOSIS — M2550 Pain in unspecified joint: Secondary | ICD-10-CM | POA: Diagnosis not present

## 2018-12-14 DIAGNOSIS — G309 Alzheimer's disease, unspecified: Secondary | ICD-10-CM | POA: Diagnosis not present

## 2018-12-14 DIAGNOSIS — R402212 Coma scale, best verbal response, none, at arrival to emergency department: Secondary | ICD-10-CM | POA: Diagnosis not present

## 2018-12-14 DIAGNOSIS — R402112 Coma scale, eyes open, never, at arrival to emergency department: Secondary | ICD-10-CM | POA: Diagnosis not present

## 2018-12-14 DIAGNOSIS — R2243 Localized swelling, mass and lump, lower limb, bilateral: Secondary | ICD-10-CM | POA: Diagnosis not present

## 2018-12-14 DIAGNOSIS — G9341 Metabolic encephalopathy: Secondary | ICD-10-CM | POA: Diagnosis not present

## 2018-12-14 DIAGNOSIS — I10 Essential (primary) hypertension: Secondary | ICD-10-CM | POA: Diagnosis not present

## 2018-12-14 DIAGNOSIS — R9431 Abnormal electrocardiogram [ECG] [EKG]: Secondary | ICD-10-CM | POA: Diagnosis not present

## 2018-12-14 DIAGNOSIS — R0902 Hypoxemia: Secondary | ICD-10-CM | POA: Diagnosis not present

## 2018-12-14 DIAGNOSIS — R531 Weakness: Secondary | ICD-10-CM | POA: Diagnosis not present

## 2018-12-14 DIAGNOSIS — F0391 Unspecified dementia with behavioral disturbance: Secondary | ICD-10-CM | POA: Diagnosis not present

## 2018-12-14 DIAGNOSIS — W1839XA Other fall on same level, initial encounter: Secondary | ICD-10-CM | POA: Diagnosis not present

## 2018-12-19 DIAGNOSIS — I10 Essential (primary) hypertension: Secondary | ICD-10-CM | POA: Diagnosis not present

## 2018-12-19 DIAGNOSIS — E870 Hyperosmolality and hypernatremia: Secondary | ICD-10-CM | POA: Diagnosis not present

## 2018-12-19 DIAGNOSIS — N179 Acute kidney failure, unspecified: Secondary | ICD-10-CM | POA: Diagnosis not present

## 2018-12-19 DIAGNOSIS — G9341 Metabolic encephalopathy: Secondary | ICD-10-CM | POA: Diagnosis not present

## 2018-12-19 MED ORDER — MEMANTINE HCL 10 MG PO TABS
10.00 | ORAL_TABLET | ORAL | Status: DC
Start: 2018-12-17 — End: 2018-12-19

## 2018-12-19 MED ORDER — ACETAMINOPHEN 325 MG PO TABS
650.00 | ORAL_TABLET | ORAL | Status: DC
Start: ? — End: 2018-12-19

## 2018-12-19 MED ORDER — TAMSULOSIN HCL 0.4 MG PO CAPS
0.40 | ORAL_CAPSULE | ORAL | Status: DC
Start: 2018-12-18 — End: 2018-12-19

## 2018-12-19 MED ORDER — SODIUM CHLORIDE 0.45 % IV SOLN
INTRAVENOUS | Status: DC
Start: ? — End: 2018-12-19

## 2018-12-19 MED ORDER — DIVALPROEX SODIUM 125 MG PO DR TAB
125.00 | DELAYED_RELEASE_TABLET | ORAL | Status: DC
Start: 2018-12-17 — End: 2018-12-19

## 2018-12-19 MED ORDER — ONDANSETRON HCL 4 MG/2ML IJ SOLN
4.00 | INTRAMUSCULAR | Status: DC
Start: ? — End: 2018-12-19

## 2018-12-19 MED ORDER — ONDANSETRON 4 MG PO TBDP
4.00 | ORAL_TABLET | ORAL | Status: DC
Start: ? — End: 2018-12-19

## 2018-12-19 MED ORDER — PROPRANOLOL HCL 10 MG PO TABS
10.00 | ORAL_TABLET | ORAL | Status: DC
Start: 2018-12-18 — End: 2018-12-19

## 2018-12-19 MED ORDER — PANTOPRAZOLE SODIUM 40 MG PO TBEC
40.00 | DELAYED_RELEASE_TABLET | ORAL | Status: DC
Start: 2018-12-18 — End: 2018-12-19

## 2018-12-19 MED ORDER — ESCITALOPRAM OXALATE 10 MG PO TABS
20.00 | ORAL_TABLET | ORAL | Status: DC
Start: 2018-12-18 — End: 2018-12-19

## 2018-12-19 MED ORDER — ATORVASTATIN CALCIUM 40 MG PO TABS
40.00 | ORAL_TABLET | ORAL | Status: DC
Start: 2018-12-18 — End: 2018-12-19

## 2018-12-19 MED ORDER — ASPIRIN EC 325 MG PO TBEC
325.00 | DELAYED_RELEASE_TABLET | ORAL | Status: DC
Start: 2018-12-18 — End: 2018-12-19

## 2018-12-19 MED ORDER — HEPARIN SODIUM (PORCINE) 5000 UNIT/ML IJ SOLN
5000.00 | INTRAMUSCULAR | Status: DC
Start: 2018-12-17 — End: 2018-12-19

## 2018-12-20 ENCOUNTER — Other Ambulatory Visit: Payer: Self-pay | Admitting: *Deleted

## 2018-12-20 NOTE — Patient Outreach (Signed)
[place] [place] [facility]) Care Management  12/20/2018  [place] [birth year] [id number]   Transition of care telephone call  Referral received: 12/20/18 Initial outreach: 12/20/18 Insurance: [place]  Initial unsuccessful telephone call to patient's wife's home number (per chart review, she is designated party of release as patient has dementia) in order to complete transition of care assessment; no answer, left HIPAA compliant voicemail message requesting return call.   Objective: Per the electronic medical record, Mr. Bascom was hospitalized at [facility] in [place] from 9/30-10/3 with hypernatremia with acute metabolic encephalopathy, acute kidney injury,  Bilateral leg edema, and a fall at the assisted living facility where he resides. Comorbidities include: CAD, s/p artery bypass graft, carotid artery disease, HTN, paroxsymal SVT, OSA, Alzheimer's disease, dementia, BPH, Hyperlipidemia He was discharged to [facility] via ambulance on 12/17/18 without the need for home health services or durable medical equipment per the discharge summary.   Plan: This RNCM will route unsuccessful outreach letter with [place] [employer] pamphlet and 24 hour Nurse Advice Line Magnet to [place] [facility] clinical pool to be mailed to patient's home address. This RNCM will attempt another outreach within 4 business days.  Barrington Ellison RN,CCM,CDE [place] Management Coordinator Office Phone [phone] Office Fax [phone]

## 2018-12-21 ENCOUNTER — Encounter: Payer: Self-pay | Admitting: *Deleted

## 2018-12-21 ENCOUNTER — Other Ambulatory Visit: Payer: Self-pay | Admitting: *Deleted

## 2018-12-21 NOTE — Patient Outreach (Addendum)
[facility] [facility]) Care Management  12/21/2018  [place] 05/03/1940 [id number]   Transition of care call/case closure   Referral received: 12/20/18 Initial outreach: 12/20/18 Insurance: [place] [employer]   Subjective: Patient's wife [name] returned call to this RNCM in response to message left on 12/20/18 on home number. 2 HIPAA identifiers verified. Explained purpose of call and completed transition of care assessment. [name] is the designated party of release for the patient and the document is visible in Epic)  [place] states Vicki has resided in [facility] care unit since late May 2020 after his care needs exceeded her ability to care for him at home. She states Loveless became dehydrated, more confused, and then had an unwitnessed fall at the facility. Even though she has directions at the facility that Bronxton is to be taken to [facility] if he ever requires acute care, he was taken to [facility] by [facility].  She says Jaivin had a CT of his head and it was normal. She says he also had some "nicks and scrapes". [name] says that Jaryd is wheelchair bound. [name] admits that she has been under a significant amount of stress in dealing with Santhosh's dementia and placement.   Objective: Per the electronic medical record, Mr. Gonnerman was hospitalized at [facility] in [place] from 9/30-10/3 with hypernatremia with acute metabolic encephalopathy, acute kidney injury,  Bilateral leg edema, and a fall at the assisted living facility where he resides. Comorbidities include: CAD, s/p artery bypass graft, carotid artery disease, HTN, paroxsymal SVT, OSA, Alzheimer's disease, dementia, BPH, Hyperlipidemia He was discharged to [facility] via ambulance on 12/17/18 without the need for home health services or additional durable medical equipment per the discharge summary.    Assessment:  Patient's wife  voices good understanding of all discharge instructions.  See transition of care flowsheet for assessment details.   Plan:  Reviewed hospital discharge diagnosis of hypernatremia and discharge treatment plan using hospital discharge instructions. Offered Glenna emotional support and, with her permission, securely e-mailed Glenna information on Alzheimer's resources and [employer] Employee Assistance Counseling Program . No ongoing care management needs identified so will close case to [place] Management services. An unsuccessful outreach letter with [place] Management pamphlet and 24 Hour Nurse Line Magnet to [place] Management was routed to the [facility] clinical pool on 10/6 to be mailed to patient's home address.  Delynn Flavin for her service as a [place] RN,CCM,CDE [place] Management Coordinator Office Phone [phone] Office Fax [phone]

## 2018-12-26 DIAGNOSIS — G301 Alzheimer's disease with late onset: Secondary | ICD-10-CM | POA: Diagnosis not present

## 2018-12-26 DIAGNOSIS — I10 Essential (primary) hypertension: Secondary | ICD-10-CM | POA: Diagnosis not present

## 2018-12-26 DIAGNOSIS — R531 Weakness: Secondary | ICD-10-CM | POA: Diagnosis not present

## 2018-12-26 DIAGNOSIS — I2510 Atherosclerotic heart disease of native coronary artery without angina pectoris: Secondary | ICD-10-CM | POA: Diagnosis not present

## 2018-12-26 DIAGNOSIS — N400 Enlarged prostate without lower urinary tract symptoms: Secondary | ICD-10-CM | POA: Diagnosis not present

## 2018-12-28 DIAGNOSIS — Z79899 Other long term (current) drug therapy: Secondary | ICD-10-CM | POA: Diagnosis not present

## 2018-12-28 DIAGNOSIS — E119 Type 2 diabetes mellitus without complications: Secondary | ICD-10-CM | POA: Diagnosis not present

## 2018-12-28 DIAGNOSIS — I739 Peripheral vascular disease, unspecified: Secondary | ICD-10-CM | POA: Diagnosis not present

## 2018-12-28 DIAGNOSIS — B351 Tinea unguium: Secondary | ICD-10-CM | POA: Diagnosis not present

## 2018-12-28 DIAGNOSIS — E7849 Other hyperlipidemia: Secondary | ICD-10-CM | POA: Diagnosis not present

## 2018-12-28 DIAGNOSIS — D518 Other vitamin B12 deficiency anemias: Secondary | ICD-10-CM | POA: Diagnosis not present

## 2018-12-29 DIAGNOSIS — N39498 Other specified urinary incontinence: Secondary | ICD-10-CM | POA: Diagnosis not present

## 2018-12-29 DIAGNOSIS — F419 Anxiety disorder, unspecified: Secondary | ICD-10-CM | POA: Diagnosis not present

## 2018-12-29 DIAGNOSIS — I10 Essential (primary) hypertension: Secondary | ICD-10-CM | POA: Diagnosis not present

## 2018-12-29 DIAGNOSIS — G40909 Epilepsy, unspecified, not intractable, without status epilepticus: Secondary | ICD-10-CM | POA: Diagnosis not present

## 2018-12-29 DIAGNOSIS — G309 Alzheimer's disease, unspecified: Secondary | ICD-10-CM | POA: Diagnosis not present

## 2018-12-29 DIAGNOSIS — K219 Gastro-esophageal reflux disease without esophagitis: Secondary | ICD-10-CM | POA: Diagnosis not present

## 2018-12-29 DIAGNOSIS — F0391 Unspecified dementia with behavioral disturbance: Secondary | ICD-10-CM | POA: Diagnosis not present

## 2018-12-29 DIAGNOSIS — N401 Enlarged prostate with lower urinary tract symptoms: Secondary | ICD-10-CM | POA: Diagnosis not present

## 2018-12-29 DIAGNOSIS — E785 Hyperlipidemia, unspecified: Secondary | ICD-10-CM | POA: Diagnosis not present

## 2018-12-29 DIAGNOSIS — R451 Restlessness and agitation: Secondary | ICD-10-CM | POA: Diagnosis not present

## 2018-12-29 DIAGNOSIS — F0280 Dementia in other diseases classified elsewhere without behavioral disturbance: Secondary | ICD-10-CM | POA: Diagnosis not present

## 2019-01-01 DIAGNOSIS — N401 Enlarged prostate with lower urinary tract symptoms: Secondary | ICD-10-CM | POA: Diagnosis not present

## 2019-01-01 DIAGNOSIS — E785 Hyperlipidemia, unspecified: Secondary | ICD-10-CM | POA: Diagnosis not present

## 2019-01-01 DIAGNOSIS — F419 Anxiety disorder, unspecified: Secondary | ICD-10-CM | POA: Diagnosis not present

## 2019-01-01 DIAGNOSIS — N39498 Other specified urinary incontinence: Secondary | ICD-10-CM | POA: Diagnosis not present

## 2019-01-01 DIAGNOSIS — F0280 Dementia in other diseases classified elsewhere without behavioral disturbance: Secondary | ICD-10-CM | POA: Diagnosis not present

## 2019-01-01 DIAGNOSIS — I10 Essential (primary) hypertension: Secondary | ICD-10-CM | POA: Diagnosis not present

## 2019-01-01 DIAGNOSIS — G40909 Epilepsy, unspecified, not intractable, without status epilepticus: Secondary | ICD-10-CM | POA: Diagnosis not present

## 2019-01-01 DIAGNOSIS — K219 Gastro-esophageal reflux disease without esophagitis: Secondary | ICD-10-CM | POA: Diagnosis not present

## 2019-01-01 DIAGNOSIS — G309 Alzheimer's disease, unspecified: Secondary | ICD-10-CM | POA: Diagnosis not present

## 2019-01-05 DIAGNOSIS — I10 Essential (primary) hypertension: Secondary | ICD-10-CM | POA: Diagnosis not present

## 2019-01-05 DIAGNOSIS — K219 Gastro-esophageal reflux disease without esophagitis: Secondary | ICD-10-CM | POA: Diagnosis not present

## 2019-01-05 DIAGNOSIS — G309 Alzheimer's disease, unspecified: Secondary | ICD-10-CM | POA: Diagnosis not present

## 2019-01-05 DIAGNOSIS — G40909 Epilepsy, unspecified, not intractable, without status epilepticus: Secondary | ICD-10-CM | POA: Diagnosis not present

## 2019-01-05 DIAGNOSIS — N39498 Other specified urinary incontinence: Secondary | ICD-10-CM | POA: Diagnosis not present

## 2019-01-05 DIAGNOSIS — F0280 Dementia in other diseases classified elsewhere without behavioral disturbance: Secondary | ICD-10-CM | POA: Diagnosis not present

## 2019-01-05 DIAGNOSIS — E785 Hyperlipidemia, unspecified: Secondary | ICD-10-CM | POA: Diagnosis not present

## 2019-01-05 DIAGNOSIS — F419 Anxiety disorder, unspecified: Secondary | ICD-10-CM | POA: Diagnosis not present

## 2019-01-05 DIAGNOSIS — N401 Enlarged prostate with lower urinary tract symptoms: Secondary | ICD-10-CM | POA: Diagnosis not present

## 2019-01-10 DIAGNOSIS — G40909 Epilepsy, unspecified, not intractable, without status epilepticus: Secondary | ICD-10-CM | POA: Diagnosis not present

## 2019-01-10 DIAGNOSIS — G309 Alzheimer's disease, unspecified: Secondary | ICD-10-CM | POA: Diagnosis not present

## 2019-01-10 DIAGNOSIS — E785 Hyperlipidemia, unspecified: Secondary | ICD-10-CM | POA: Diagnosis not present

## 2019-01-10 DIAGNOSIS — K219 Gastro-esophageal reflux disease without esophagitis: Secondary | ICD-10-CM | POA: Diagnosis not present

## 2019-01-10 DIAGNOSIS — F0280 Dementia in other diseases classified elsewhere without behavioral disturbance: Secondary | ICD-10-CM | POA: Diagnosis not present

## 2019-01-10 DIAGNOSIS — F419 Anxiety disorder, unspecified: Secondary | ICD-10-CM | POA: Diagnosis not present

## 2019-01-10 DIAGNOSIS — N401 Enlarged prostate with lower urinary tract symptoms: Secondary | ICD-10-CM | POA: Diagnosis not present

## 2019-01-10 DIAGNOSIS — N39498 Other specified urinary incontinence: Secondary | ICD-10-CM | POA: Diagnosis not present

## 2019-01-10 DIAGNOSIS — I10 Essential (primary) hypertension: Secondary | ICD-10-CM | POA: Diagnosis not present

## 2019-01-12 DIAGNOSIS — E559 Vitamin D deficiency, unspecified: Secondary | ICD-10-CM | POA: Diagnosis not present

## 2019-01-12 DIAGNOSIS — D518 Other vitamin B12 deficiency anemias: Secondary | ICD-10-CM | POA: Diagnosis not present

## 2019-01-12 DIAGNOSIS — E038 Other specified hypothyroidism: Secondary | ICD-10-CM | POA: Diagnosis not present

## 2019-01-12 DIAGNOSIS — E119 Type 2 diabetes mellitus without complications: Secondary | ICD-10-CM | POA: Diagnosis not present

## 2019-01-12 DIAGNOSIS — E7849 Other hyperlipidemia: Secondary | ICD-10-CM | POA: Diagnosis not present

## 2019-01-12 DIAGNOSIS — Z79899 Other long term (current) drug therapy: Secondary | ICD-10-CM | POA: Diagnosis not present

## 2019-01-12 DIAGNOSIS — U071 COVID-19: Secondary | ICD-10-CM | POA: Diagnosis not present

## 2019-01-16 DIAGNOSIS — I10 Essential (primary) hypertension: Secondary | ICD-10-CM | POA: Diagnosis not present

## 2019-01-16 DIAGNOSIS — N401 Enlarged prostate with lower urinary tract symptoms: Secondary | ICD-10-CM | POA: Diagnosis not present

## 2019-01-16 DIAGNOSIS — F0280 Dementia in other diseases classified elsewhere without behavioral disturbance: Secondary | ICD-10-CM | POA: Diagnosis not present

## 2019-01-16 DIAGNOSIS — F419 Anxiety disorder, unspecified: Secondary | ICD-10-CM | POA: Diagnosis not present

## 2019-01-16 DIAGNOSIS — N39498 Other specified urinary incontinence: Secondary | ICD-10-CM | POA: Diagnosis not present

## 2019-01-16 DIAGNOSIS — G40909 Epilepsy, unspecified, not intractable, without status epilepticus: Secondary | ICD-10-CM | POA: Diagnosis not present

## 2019-01-16 DIAGNOSIS — K219 Gastro-esophageal reflux disease without esophagitis: Secondary | ICD-10-CM | POA: Diagnosis not present

## 2019-01-16 DIAGNOSIS — G309 Alzheimer's disease, unspecified: Secondary | ICD-10-CM | POA: Diagnosis not present

## 2019-01-16 DIAGNOSIS — I739 Peripheral vascular disease, unspecified: Secondary | ICD-10-CM | POA: Diagnosis not present

## 2019-01-16 DIAGNOSIS — I2510 Atherosclerotic heart disease of native coronary artery without angina pectoris: Secondary | ICD-10-CM | POA: Diagnosis not present

## 2019-01-16 DIAGNOSIS — E785 Hyperlipidemia, unspecified: Secondary | ICD-10-CM | POA: Diagnosis not present

## 2019-01-16 DIAGNOSIS — R1310 Dysphagia, unspecified: Secondary | ICD-10-CM | POA: Diagnosis not present

## 2019-01-26 DIAGNOSIS — G40909 Epilepsy, unspecified, not intractable, without status epilepticus: Secondary | ICD-10-CM | POA: Diagnosis not present

## 2019-01-26 DIAGNOSIS — I10 Essential (primary) hypertension: Secondary | ICD-10-CM | POA: Diagnosis not present

## 2019-01-26 DIAGNOSIS — F0280 Dementia in other diseases classified elsewhere without behavioral disturbance: Secondary | ICD-10-CM | POA: Diagnosis not present

## 2019-01-26 DIAGNOSIS — G309 Alzheimer's disease, unspecified: Secondary | ICD-10-CM | POA: Diagnosis not present

## 2019-01-30 MED FILL — FOLIC ACID 1 MG TABS: 1 | 90 days supply | Qty: 90 | Fill #1

## 2019-01-30 MED FILL — risperiDONE 0.25 MG TABS: 0.25 | 90 days supply | Qty: 180 | Fill #1

## 2019-01-30 MED FILL — DIVALPROEX SOD ER 500 MG TA: 500 | 90 days supply | Qty: 180 | Fill #1

## 2019-01-30 MED FILL — PROPRANOLOL 10 MG TABLET: 10 | 90 days supply | Qty: 90 | Fill #1

## 2019-01-30 MED FILL — traZODone HCL 50 MG TABS: 50 | 90 days supply | Qty: 90 | Fill #1

## 2019-01-30 MED FILL — MELATONIN 3 MG TABS: 3 | 90 days supply | Qty: 180 | Fill #1

## 2019-01-30 MED FILL — MEMANTINE HCL 10 MG TABS: 10 | 90 days supply | Qty: 180 | Fill #1

## 2019-01-30 MED FILL — ESCITALOPRAM 10 MG TABLET: 10 | 90 days supply | Qty: 180 | Fill #1

## 2019-01-30 MED FILL — TAMSULOSIN HCL 0.4 MG CAP: 0.4 | 90 days supply | Qty: 90 | Fill #1

## 2019-01-30 MED FILL — PANTOPRAZOLE SOD DR 20 MG T: 20 | 90 days supply | Qty: 90 | Fill #1

## 2019-01-30 MED FILL — ASPIRIN EC 325 MG TABLET: 325 | 90 days supply | Qty: 90 | Fill #1

## 2019-02-02 DIAGNOSIS — R451 Restlessness and agitation: Secondary | ICD-10-CM | POA: Diagnosis not present

## 2019-02-02 DIAGNOSIS — F419 Anxiety disorder, unspecified: Secondary | ICD-10-CM | POA: Diagnosis not present

## 2019-02-02 DIAGNOSIS — F0391 Unspecified dementia with behavioral disturbance: Secondary | ICD-10-CM | POA: Diagnosis not present

## 2019-02-13 DIAGNOSIS — I2510 Atherosclerotic heart disease of native coronary artery without angina pectoris: Secondary | ICD-10-CM | POA: Diagnosis not present

## 2019-02-13 DIAGNOSIS — I10 Essential (primary) hypertension: Secondary | ICD-10-CM | POA: Diagnosis not present

## 2019-02-13 DIAGNOSIS — R2681 Unsteadiness on feet: Secondary | ICD-10-CM | POA: Diagnosis not present

## 2019-02-13 DIAGNOSIS — G301 Alzheimer's disease with late onset: Secondary | ICD-10-CM | POA: Diagnosis not present

## 2019-02-28 DIAGNOSIS — F0391 Unspecified dementia with behavioral disturbance: Secondary | ICD-10-CM | POA: Diagnosis not present

## 2019-02-28 DIAGNOSIS — R451 Restlessness and agitation: Secondary | ICD-10-CM | POA: Diagnosis not present

## 2019-02-28 DIAGNOSIS — F419 Anxiety disorder, unspecified: Secondary | ICD-10-CM | POA: Diagnosis not present

## 2019-03-04 MED FILL — LEVOTHYROXINE 50 MCG TABLET: 50 | 90 days supply | Qty: 90 | Fill #1

## 2019-03-13 DIAGNOSIS — I2510 Atherosclerotic heart disease of native coronary artery without angina pectoris: Secondary | ICD-10-CM | POA: Diagnosis not present

## 2019-03-13 DIAGNOSIS — I10 Essential (primary) hypertension: Secondary | ICD-10-CM | POA: Diagnosis not present

## 2019-03-13 DIAGNOSIS — G301 Alzheimer's disease with late onset: Secondary | ICD-10-CM | POA: Diagnosis not present

## 2019-03-13 DIAGNOSIS — N400 Enlarged prostate without lower urinary tract symptoms: Secondary | ICD-10-CM | POA: Diagnosis not present

## 2019-03-29 MED FILL — CERTAVITE/ANTIOXIDANTS TABS: 130 days supply | Qty: 130 | Fill #1

## 2019-04-06 DIAGNOSIS — R451 Restlessness and agitation: Secondary | ICD-10-CM | POA: Diagnosis not present

## 2019-04-06 DIAGNOSIS — F419 Anxiety disorder, unspecified: Secondary | ICD-10-CM | POA: Diagnosis not present

## 2019-04-06 DIAGNOSIS — F0391 Unspecified dementia with behavioral disturbance: Secondary | ICD-10-CM | POA: Diagnosis not present

## 2019-04-10 DIAGNOSIS — G300 Alzheimer's disease with early onset: Secondary | ICD-10-CM | POA: Diagnosis not present

## 2019-04-10 DIAGNOSIS — I2510 Atherosclerotic heart disease of native coronary artery without angina pectoris: Secondary | ICD-10-CM | POA: Diagnosis not present

## 2019-04-10 DIAGNOSIS — I10 Essential (primary) hypertension: Secondary | ICD-10-CM | POA: Diagnosis not present

## 2019-04-10 DIAGNOSIS — N400 Enlarged prostate without lower urinary tract symptoms: Secondary | ICD-10-CM | POA: Diagnosis not present

## 2019-04-20 DIAGNOSIS — D518 Other vitamin B12 deficiency anemias: Secondary | ICD-10-CM | POA: Diagnosis not present

## 2019-04-20 DIAGNOSIS — E559 Vitamin D deficiency, unspecified: Secondary | ICD-10-CM | POA: Diagnosis not present

## 2019-04-20 DIAGNOSIS — Z79899 Other long term (current) drug therapy: Secondary | ICD-10-CM | POA: Diagnosis not present

## 2019-04-20 DIAGNOSIS — E119 Type 2 diabetes mellitus without complications: Secondary | ICD-10-CM | POA: Diagnosis not present

## 2019-04-20 DIAGNOSIS — E7849 Other hyperlipidemia: Secondary | ICD-10-CM | POA: Diagnosis not present

## 2019-04-20 DIAGNOSIS — E038 Other specified hypothyroidism: Secondary | ICD-10-CM | POA: Diagnosis not present

## 2019-04-21 DIAGNOSIS — Z20828 Contact with and (suspected) exposure to other viral communicable diseases: Secondary | ICD-10-CM | POA: Diagnosis not present

## 2019-04-21 DIAGNOSIS — U071 COVID-19: Secondary | ICD-10-CM | POA: Diagnosis not present

## 2019-04-30 MED FILL — traZODone HCL 50 MG TABS: 50 | 90 days supply | Qty: 90 | Fill #2

## 2019-04-30 MED FILL — DIVALPROEX SOD ER 500 MG TA: 500 | 90 days supply | Qty: 180 | Fill #2

## 2019-04-30 MED FILL — PANTOPRAZOLE SOD DR 20 MG T: 20 | 90 days supply | Qty: 90 | Fill #2

## 2019-04-30 MED FILL — ESCITALOPRAM 10 MG TABLET: 10 | 90 days supply | Qty: 180 | Fill #2

## 2019-04-30 MED FILL — ASPIRIN EC 325 MG TABLET: 325 | 90 days supply | Qty: 90 | Fill #2

## 2019-04-30 MED FILL — TAMSULOSIN HCL 0.4 MG CAP: 0.4 | 90 days supply | Qty: 90 | Fill #2

## 2019-04-30 MED FILL — FOLIC ACID 1 MG TABS: 1 | 90 days supply | Qty: 90 | Fill #2

## 2019-05-01 MED FILL — PROPRANOLOL 10 MG TABLET: 10 | 90 days supply | Qty: 90 | Fill #2

## 2019-05-01 MED FILL — MEMANTINE HCL 10 MG TABS: 10 | 90 days supply | Qty: 180 | Fill #2

## 2019-05-01 MED FILL — risperiDONE 0.25 MG TABS: 0.25 | 90 days supply | Qty: 180 | Fill #2

## 2019-05-01 MED FILL — MELATONIN 3 MG TABS: 3 | 90 days supply | Qty: 180 | Fill #2

## 2019-05-05 DIAGNOSIS — F419 Anxiety disorder, unspecified: Secondary | ICD-10-CM | POA: Diagnosis not present

## 2019-05-05 DIAGNOSIS — R451 Restlessness and agitation: Secondary | ICD-10-CM | POA: Diagnosis not present

## 2019-05-05 DIAGNOSIS — F0391 Unspecified dementia with behavioral disturbance: Secondary | ICD-10-CM | POA: Diagnosis not present

## 2019-05-08 DIAGNOSIS — I10 Essential (primary) hypertension: Secondary | ICD-10-CM | POA: Diagnosis not present

## 2019-05-08 DIAGNOSIS — R2681 Unsteadiness on feet: Secondary | ICD-10-CM | POA: Diagnosis not present

## 2019-05-08 DIAGNOSIS — G301 Alzheimer's disease with late onset: Secondary | ICD-10-CM | POA: Diagnosis not present

## 2019-05-08 DIAGNOSIS — I2510 Atherosclerotic heart disease of native coronary artery without angina pectoris: Secondary | ICD-10-CM | POA: Diagnosis not present

## 2019-05-08 MED FILL — ZINC SULFATE 220 MG TABLET: 220 (50 ZN) | 100 days supply | Qty: 100 | Fill #0

## 2019-05-08 MED FILL — B COMPLEX TABLET: 100 days supply | Qty: 100 | Fill #0

## 2019-05-08 MED FILL — VITAMIN C 500 MG TABLET: 500 | 50 days supply | Qty: 100 | Fill #0

## 2019-05-09 MED FILL — VITAMIN D3 2,000 UNIT TAB: 50 MCG | 100 days supply | Qty: 100 | Fill #0

## 2019-05-10 DIAGNOSIS — U071 COVID-19: Secondary | ICD-10-CM | POA: Diagnosis not present

## 2019-05-10 DIAGNOSIS — Z20828 Contact with and (suspected) exposure to other viral communicable diseases: Secondary | ICD-10-CM | POA: Diagnosis not present

## 2019-05-17 DIAGNOSIS — Z20828 Contact with and (suspected) exposure to other viral communicable diseases: Secondary | ICD-10-CM | POA: Diagnosis not present

## 2019-05-17 DIAGNOSIS — U071 COVID-19: Secondary | ICD-10-CM | POA: Diagnosis not present

## 2019-05-19 DIAGNOSIS — I10 Essential (primary) hypertension: Secondary | ICD-10-CM | POA: Diagnosis not present

## 2019-05-24 DIAGNOSIS — Z20828 Contact with and (suspected) exposure to other viral communicable diseases: Secondary | ICD-10-CM | POA: Diagnosis not present

## 2019-05-24 DIAGNOSIS — U071 COVID-19: Secondary | ICD-10-CM | POA: Diagnosis not present

## 2019-05-31 DIAGNOSIS — Z20828 Contact with and (suspected) exposure to other viral communicable diseases: Secondary | ICD-10-CM | POA: Diagnosis not present

## 2019-05-31 DIAGNOSIS — U071 COVID-19: Secondary | ICD-10-CM | POA: Diagnosis not present

## 2019-06-01 DIAGNOSIS — Z79899 Other long term (current) drug therapy: Secondary | ICD-10-CM | POA: Diagnosis not present

## 2019-06-01 DIAGNOSIS — R451 Restlessness and agitation: Secondary | ICD-10-CM | POA: Diagnosis not present

## 2019-06-01 DIAGNOSIS — F0391 Unspecified dementia with behavioral disturbance: Secondary | ICD-10-CM | POA: Diagnosis not present

## 2019-06-01 DIAGNOSIS — F419 Anxiety disorder, unspecified: Secondary | ICD-10-CM | POA: Diagnosis not present

## 2019-06-02 MED FILL — LEVOTHYROXINE 50 MCG TABLET: 50 | 90 days supply | Qty: 90 | Fill #2

## 2019-06-05 DIAGNOSIS — I2510 Atherosclerotic heart disease of native coronary artery without angina pectoris: Secondary | ICD-10-CM | POA: Diagnosis not present

## 2019-06-05 DIAGNOSIS — G301 Alzheimer's disease with late onset: Secondary | ICD-10-CM | POA: Diagnosis not present

## 2019-06-05 DIAGNOSIS — I10 Essential (primary) hypertension: Secondary | ICD-10-CM | POA: Diagnosis not present

## 2019-06-05 DIAGNOSIS — N400 Enlarged prostate without lower urinary tract symptoms: Secondary | ICD-10-CM | POA: Diagnosis not present

## 2019-06-07 DIAGNOSIS — U071 COVID-19: Secondary | ICD-10-CM | POA: Diagnosis not present

## 2019-06-07 DIAGNOSIS — Z20828 Contact with and (suspected) exposure to other viral communicable diseases: Secondary | ICD-10-CM | POA: Diagnosis not present

## 2019-06-14 DIAGNOSIS — U071 COVID-19: Secondary | ICD-10-CM | POA: Diagnosis not present

## 2019-06-14 DIAGNOSIS — Z20828 Contact with and (suspected) exposure to other viral communicable diseases: Secondary | ICD-10-CM | POA: Diagnosis not present

## 2019-06-27 DIAGNOSIS — J45909 Unspecified asthma, uncomplicated: Secondary | ICD-10-CM | POA: Diagnosis not present

## 2019-06-27 DIAGNOSIS — I2510 Atherosclerotic heart disease of native coronary artery without angina pectoris: Secondary | ICD-10-CM | POA: Diagnosis not present

## 2019-06-27 DIAGNOSIS — N400 Enlarged prostate without lower urinary tract symptoms: Secondary | ICD-10-CM | POA: Diagnosis not present

## 2019-06-27 DIAGNOSIS — I10 Essential (primary) hypertension: Secondary | ICD-10-CM | POA: Diagnosis not present

## 2019-06-27 DIAGNOSIS — G301 Alzheimer's disease with late onset: Secondary | ICD-10-CM | POA: Diagnosis not present

## 2019-07-06 DIAGNOSIS — F411 Generalized anxiety disorder: Secondary | ICD-10-CM | POA: Diagnosis not present

## 2019-07-06 DIAGNOSIS — F4322 Adjustment disorder with anxiety: Secondary | ICD-10-CM | POA: Diagnosis not present

## 2019-07-18 DIAGNOSIS — F331 Major depressive disorder, recurrent, moderate: Secondary | ICD-10-CM | POA: Diagnosis not present

## 2019-07-18 DIAGNOSIS — G301 Alzheimer's disease with late onset: Secondary | ICD-10-CM | POA: Diagnosis not present

## 2019-07-19 DIAGNOSIS — D518 Other vitamin B12 deficiency anemias: Secondary | ICD-10-CM | POA: Diagnosis not present

## 2019-07-19 DIAGNOSIS — Z79899 Other long term (current) drug therapy: Secondary | ICD-10-CM | POA: Diagnosis not present

## 2019-07-19 DIAGNOSIS — E7849 Other hyperlipidemia: Secondary | ICD-10-CM | POA: Diagnosis not present

## 2019-07-19 DIAGNOSIS — E038 Other specified hypothyroidism: Secondary | ICD-10-CM | POA: Diagnosis not present

## 2019-07-27 DIAGNOSIS — J45909 Unspecified asthma, uncomplicated: Secondary | ICD-10-CM | POA: Diagnosis not present

## 2019-07-27 DIAGNOSIS — N400 Enlarged prostate without lower urinary tract symptoms: Secondary | ICD-10-CM | POA: Diagnosis not present

## 2019-07-27 DIAGNOSIS — G301 Alzheimer's disease with late onset: Secondary | ICD-10-CM | POA: Diagnosis not present

## 2019-07-27 DIAGNOSIS — I2510 Atherosclerotic heart disease of native coronary artery without angina pectoris: Secondary | ICD-10-CM | POA: Diagnosis not present

## 2019-08-23 DIAGNOSIS — I10 Essential (primary) hypertension: Secondary | ICD-10-CM | POA: Diagnosis not present

## 2019-08-23 DIAGNOSIS — R011 Cardiac murmur, unspecified: Secondary | ICD-10-CM | POA: Diagnosis not present

## 2019-08-29 DIAGNOSIS — R590 Localized enlarged lymph nodes: Secondary | ICD-10-CM | POA: Diagnosis not present

## 2019-08-29 DIAGNOSIS — R229 Localized swelling, mass and lump, unspecified: Secondary | ICD-10-CM | POA: Diagnosis not present

## 2019-08-29 DIAGNOSIS — D492 Neoplasm of unspecified behavior of bone, soft tissue, and skin: Secondary | ICD-10-CM | POA: Diagnosis not present

## 2019-08-29 DIAGNOSIS — R0989 Other specified symptoms and signs involving the circulatory and respiratory systems: Secondary | ICD-10-CM | POA: Diagnosis not present

## 2019-08-29 DIAGNOSIS — I6523 Occlusion and stenosis of bilateral carotid arteries: Secondary | ICD-10-CM | POA: Diagnosis not present

## 2019-08-31 DIAGNOSIS — N400 Enlarged prostate without lower urinary tract symptoms: Secondary | ICD-10-CM | POA: Diagnosis not present

## 2019-08-31 DIAGNOSIS — I2510 Atherosclerotic heart disease of native coronary artery without angina pectoris: Secondary | ICD-10-CM | POA: Diagnosis not present

## 2019-08-31 DIAGNOSIS — I10 Essential (primary) hypertension: Secondary | ICD-10-CM | POA: Diagnosis not present

## 2019-08-31 DIAGNOSIS — G301 Alzheimer's disease with late onset: Secondary | ICD-10-CM | POA: Diagnosis not present

## 2019-08-31 MED FILL — CERTAVITE/ANTIOXIDANTS TABS: 130 days supply | Qty: 130 | Fill #0

## 2019-08-31 MED FILL — LEVOTHYROXINE 50 MCG TABLET: 50 | 90 days supply | Qty: 90 | Fill #3

## 2019-09-01 DIAGNOSIS — E042 Nontoxic multinodular goiter: Secondary | ICD-10-CM | POA: Diagnosis not present

## 2019-09-01 DIAGNOSIS — I700 Atherosclerosis of aorta: Secondary | ICD-10-CM | POA: Diagnosis not present

## 2019-09-01 DIAGNOSIS — R221 Localized swelling, mass and lump, neck: Secondary | ICD-10-CM | POA: Diagnosis not present

## 2019-09-01 DIAGNOSIS — I714 Abdominal aortic aneurysm, without rupture: Secondary | ICD-10-CM | POA: Diagnosis not present

## 2019-09-22 DIAGNOSIS — G4721 Circadian rhythm sleep disorder, delayed sleep phase type: Secondary | ICD-10-CM | POA: Diagnosis not present

## 2019-09-22 DIAGNOSIS — F330 Major depressive disorder, recurrent, mild: Secondary | ICD-10-CM | POA: Diagnosis not present

## 2019-09-22 DIAGNOSIS — F411 Generalized anxiety disorder: Secondary | ICD-10-CM | POA: Diagnosis not present

## 2019-09-28 DIAGNOSIS — G301 Alzheimer's disease with late onset: Secondary | ICD-10-CM | POA: Diagnosis not present

## 2019-09-28 DIAGNOSIS — I2510 Atherosclerotic heart disease of native coronary artery without angina pectoris: Secondary | ICD-10-CM | POA: Diagnosis not present

## 2019-09-28 DIAGNOSIS — I10 Essential (primary) hypertension: Secondary | ICD-10-CM | POA: Diagnosis not present

## 2019-09-28 DIAGNOSIS — J45909 Unspecified asthma, uncomplicated: Secondary | ICD-10-CM | POA: Diagnosis not present

## 2019-10-17 DIAGNOSIS — G301 Alzheimer's disease with late onset: Secondary | ICD-10-CM | POA: Diagnosis not present

## 2019-10-17 DIAGNOSIS — F330 Major depressive disorder, recurrent, mild: Secondary | ICD-10-CM | POA: Diagnosis not present

## 2019-10-17 DIAGNOSIS — G4721 Circadian rhythm sleep disorder, delayed sleep phase type: Secondary | ICD-10-CM | POA: Diagnosis not present

## 2019-10-17 DIAGNOSIS — F411 Generalized anxiety disorder: Secondary | ICD-10-CM | POA: Diagnosis not present

## 2019-10-27 MED FILL — MEMANTINE HCL 10 MG TABS: 10 | 30 days supply | Qty: 60 | Fill #0

## 2019-11-09 DIAGNOSIS — I10 Essential (primary) hypertension: Secondary | ICD-10-CM | POA: Diagnosis not present

## 2019-11-09 DIAGNOSIS — I2510 Atherosclerotic heart disease of native coronary artery without angina pectoris: Secondary | ICD-10-CM | POA: Diagnosis not present

## 2019-11-09 DIAGNOSIS — I739 Peripheral vascular disease, unspecified: Secondary | ICD-10-CM | POA: Diagnosis not present

## 2019-11-09 DIAGNOSIS — R600 Localized edema: Secondary | ICD-10-CM | POA: Diagnosis not present

## 2019-11-09 MED FILL — TAMSULOSIN HCL 0.4 MG CAP: 0.4 | 30 days supply | Qty: 30 | Fill #0

## 2019-11-09 MED FILL — FOLIC ACID 1 MG TABS: 1 | 30 days supply | Qty: 30 | Fill #0

## 2019-11-09 MED FILL — PROPRANOLOL 10 MG TABLET: 10 | 30 days supply | Qty: 30 | Fill #0

## 2019-11-09 MED FILL — traZODone HCL 50 MG TABS: 50 | 30 days supply | Qty: 30 | Fill #0

## 2019-11-09 MED FILL — DIVALPROEX SOD ER 500 MG TA: 500 | 30 days supply | Qty: 30 | Fill #0

## 2019-11-09 MED FILL — risperiDONE 0.25 MG TABS: 0.25 | 30 days supply | Qty: 60 | Fill #0

## 2019-11-09 MED FILL — ESCITALOPRAM 10 MG TABLET: 10 | 30 days supply | Qty: 30 | Fill #0

## 2019-11-09 MED FILL — ASPIRIN EC 325 MG TABLET: 325 | 90 days supply | Qty: 90 | Fill #0

## 2019-11-09 MED FILL — LORazepam 0.5 MG TABS: 0.5 | 60 days supply | Qty: 60 | Fill #0

## 2019-11-17 DIAGNOSIS — D518 Other vitamin B12 deficiency anemias: Secondary | ICD-10-CM | POA: Diagnosis not present

## 2019-11-17 DIAGNOSIS — Z79899 Other long term (current) drug therapy: Secondary | ICD-10-CM | POA: Diagnosis not present

## 2019-11-17 DIAGNOSIS — E119 Type 2 diabetes mellitus without complications: Secondary | ICD-10-CM | POA: Diagnosis not present

## 2019-11-17 DIAGNOSIS — E7849 Other hyperlipidemia: Secondary | ICD-10-CM | POA: Diagnosis not present

## 2019-11-22 MED FILL — MEMANTINE HCL 10 MG TABS: 10 | 30 days supply | Qty: 60 | Fill #1

## 2019-11-23 DIAGNOSIS — G301 Alzheimer's disease with late onset: Secondary | ICD-10-CM | POA: Diagnosis not present

## 2019-11-23 DIAGNOSIS — R451 Restlessness and agitation: Secondary | ICD-10-CM | POA: Diagnosis not present

## 2019-11-29 MED FILL — LEVOTHYROXINE 50 MCG TABLET: 50 | 30 days supply | Qty: 30 | Fill #0

## 2019-12-04 MED FILL — DIVALPROEX SOD ER 500 MG TA: 500 | 30 days supply | Qty: 30 | Fill #1

## 2019-12-04 MED FILL — TAMSULOSIN HCL 0.4 MG CAP: 0.4 | 30 days supply | Qty: 30 | Fill #1

## 2019-12-04 MED FILL — risperiDONE 0.25 MG TABS: 0.25 | 30 days supply | Qty: 60 | Fill #1

## 2019-12-04 MED FILL — ESCITALOPRAM 10 MG TABLET: 10 | 30 days supply | Qty: 30 | Fill #1

## 2019-12-04 MED FILL — PROPRANOLOL 10 MG TABLET: 10 | 30 days supply | Qty: 30 | Fill #1

## 2019-12-04 MED FILL — traZODone HCL 50 MG TABS: 50 | 30 days supply | Qty: 30 | Fill #1

## 2019-12-04 MED FILL — FOLIC ACID 1 MG TABS: 1 | 30 days supply | Qty: 30 | Fill #1

## 2019-12-06 DIAGNOSIS — G301 Alzheimer's disease with late onset: Secondary | ICD-10-CM | POA: Diagnosis not present

## 2019-12-06 DIAGNOSIS — F411 Generalized anxiety disorder: Secondary | ICD-10-CM | POA: Diagnosis not present

## 2019-12-06 DIAGNOSIS — F330 Major depressive disorder, recurrent, mild: Secondary | ICD-10-CM | POA: Diagnosis not present

## 2019-12-06 DIAGNOSIS — G4721 Circadian rhythm sleep disorder, delayed sleep phase type: Secondary | ICD-10-CM | POA: Diagnosis not present

## 2019-12-07 DIAGNOSIS — I10 Essential (primary) hypertension: Secondary | ICD-10-CM | POA: Diagnosis not present

## 2019-12-07 DIAGNOSIS — N400 Enlarged prostate without lower urinary tract symptoms: Secondary | ICD-10-CM | POA: Diagnosis not present

## 2019-12-07 DIAGNOSIS — J45909 Unspecified asthma, uncomplicated: Secondary | ICD-10-CM | POA: Diagnosis not present

## 2019-12-07 DIAGNOSIS — I2510 Atherosclerotic heart disease of native coronary artery without angina pectoris: Secondary | ICD-10-CM | POA: Diagnosis not present

## 2019-12-20 MED FILL — MEMANTINE HCL 10 MG TABS: 10 | 30 days supply | Qty: 60 | Fill #2

## 2019-12-23 MED FILL — LEVOTHYROXINE 50 MCG TABLET: 50 | 30 days supply | Qty: 30 | Fill #1

## 2020-01-02 DIAGNOSIS — G4721 Circadian rhythm sleep disorder, delayed sleep phase type: Secondary | ICD-10-CM | POA: Diagnosis not present

## 2020-01-02 DIAGNOSIS — F411 Generalized anxiety disorder: Secondary | ICD-10-CM | POA: Diagnosis not present

## 2020-01-02 DIAGNOSIS — G301 Alzheimer's disease with late onset: Secondary | ICD-10-CM | POA: Diagnosis not present

## 2020-01-02 DIAGNOSIS — F330 Major depressive disorder, recurrent, mild: Secondary | ICD-10-CM | POA: Diagnosis not present

## 2020-01-02 MED FILL — FOLIC ACID 1 MG TABS: 1 | 30 days supply | Qty: 30 | Fill #2

## 2020-01-02 MED FILL — PROPRANOLOL 10 MG TABLET: 10 | 30 days supply | Qty: 30 | Fill #2

## 2020-01-02 MED FILL — risperiDONE 0.25 MG TABS: 0.25 | 30 days supply | Qty: 60 | Fill #2

## 2020-01-02 MED FILL — TAMSULOSIN HCL 0.4 MG CAP: 0.4 | 30 days supply | Qty: 30 | Fill #2

## 2020-01-02 MED FILL — ESCITALOPRAM 10 MG TABLET: 10 | 30 days supply | Qty: 30 | Fill #2

## 2020-01-02 MED FILL — traZODone HCL 50 MG TABS: 50 | 30 days supply | Qty: 30 | Fill #2

## 2020-01-02 MED FILL — DIVALPROEX SOD ER 500 MG TA: 500 | 30 days supply | Qty: 30 | Fill #2

## 2020-01-04 ENCOUNTER — Emergency Department (HOSPITAL_COMMUNITY)
Admission: EM | Admit: 2020-01-04 | Discharge: 2020-01-04 | Disposition: A | Discharge status: Home / Self Care | Payer: 59 | Attending: Emergency Medicine | Admitting: Emergency Medicine

## 2020-01-04 ENCOUNTER — Emergency Department (HOSPITAL_COMMUNITY): Payer: 59

## 2020-01-04 DIAGNOSIS — I2510 Atherosclerotic heart disease of native coronary artery without angina pectoris: Secondary | ICD-10-CM | POA: Diagnosis not present

## 2020-01-04 DIAGNOSIS — R4182 Altered mental status, unspecified: Secondary | ICD-10-CM | POA: Diagnosis not present

## 2020-01-04 DIAGNOSIS — G309 Alzheimer's disease, unspecified: Secondary | ICD-10-CM | POA: Diagnosis not present

## 2020-01-04 DIAGNOSIS — Z7984 Long term (current) use of oral hypoglycemic drugs: Secondary | ICD-10-CM | POA: Diagnosis not present

## 2020-01-04 DIAGNOSIS — I110 Hypertensive heart disease with heart failure: Secondary | ICD-10-CM | POA: Insufficient documentation

## 2020-01-04 DIAGNOSIS — Z20822 Contact with and (suspected) exposure to covid-19: Secondary | ICD-10-CM | POA: Insufficient documentation

## 2020-01-04 DIAGNOSIS — Z7983 Long term (current) use of bisphosphonates: Secondary | ICD-10-CM | POA: Diagnosis not present

## 2020-01-04 DIAGNOSIS — R531 Weakness: Secondary | ICD-10-CM

## 2020-01-04 DIAGNOSIS — G319 Degenerative disease of nervous system, unspecified: Secondary | ICD-10-CM | POA: Diagnosis not present

## 2020-01-04 DIAGNOSIS — G9389 Other specified disorders of brain: Secondary | ICD-10-CM | POA: Diagnosis not present

## 2020-01-04 DIAGNOSIS — G451 Carotid artery syndrome (hemispheric): Secondary | ICD-10-CM | POA: Diagnosis not present

## 2020-01-04 DIAGNOSIS — F0280 Dementia in other diseases classified elsewhere without behavioral disturbance: Secondary | ICD-10-CM | POA: Insufficient documentation

## 2020-01-04 DIAGNOSIS — M6281 Muscle weakness (generalized): Secondary | ICD-10-CM | POA: Diagnosis not present

## 2020-01-04 DIAGNOSIS — R29810 Facial weakness: Secondary | ICD-10-CM | POA: Diagnosis not present

## 2020-01-04 DIAGNOSIS — I509 Heart failure, unspecified: Secondary | ICD-10-CM | POA: Insufficient documentation

## 2020-01-04 DIAGNOSIS — R0902 Hypoxemia: Secondary | ICD-10-CM | POA: Diagnosis not present

## 2020-01-04 DIAGNOSIS — Z79899 Other long term (current) drug therapy: Secondary | ICD-10-CM | POA: Diagnosis not present

## 2020-01-04 LAB — URINALYSIS, ROUTINE W REFLEX MICROSCOPIC
Bilirubin Urine: NEGATIVE
Glucose, UA: NEGATIVE mg/dL
Hgb urine dipstick: NEGATIVE
Ketones, ur: 20 mg/dL — AB
Leukocytes,Ua: NEGATIVE
Nitrite: NEGATIVE
Protein, ur: NEGATIVE mg/dL
Specific Gravity, Urine: 1.019 (ref 1.005–1.030)
pH: 6 (ref 5.0–8.0)

## 2020-01-04 LAB — CBC WITH DIFFERENTIAL/PLATELET
Abs Immature Granulocytes: 0.02 10*3/uL (ref 0.00–0.07)
Basophils Absolute: 0 10*3/uL (ref 0.0–0.1)
Basophils Relative: 0 %
Eosinophils Absolute: 0.2 10*3/uL (ref 0.0–0.5)
Eosinophils Relative: 3 %
HCT: 43.7 % (ref 39.0–52.0)
Hemoglobin: 13.9 g/dL (ref 13.0–17.0)
Immature Granulocytes: 0 %
Lymphocytes Relative: 26 %
Lymphs Abs: 1.8 10*3/uL (ref 0.7–4.0)
MCH: 32.3 pg (ref 26.0–34.0)
MCHC: 31.8 g/dL (ref 30.0–36.0)
MCV: 101.6 fL — ABNORMAL HIGH (ref 80.0–100.0)
Monocytes Absolute: 0.9 10*3/uL (ref 0.1–1.0)
Monocytes Relative: 13 %
Neutro Abs: 3.8 10*3/uL (ref 1.7–7.7)
Neutrophils Relative %: 58 %
Platelets: 154 10*3/uL (ref 150–400)
RBC: 4.3 MIL/uL (ref 4.22–5.81)
RDW: 13 % (ref 11.5–15.5)
WBC: 6.7 10*3/uL (ref 4.0–10.5)
nRBC: 0 % (ref 0.0–0.2)

## 2020-01-04 LAB — RESPIRATORY PANEL BY RT PCR (FLU A&B, COVID)
Influenza A by PCR: NEGATIVE
Influenza B by PCR: NEGATIVE
SARS Coronavirus 2 by RT PCR: NEGATIVE

## 2020-01-04 LAB — I-STAT CHEM 8, ED
BUN: 21 mg/dL (ref 8–23)
Calcium, Ion: 1.15 mmol/L (ref 1.15–1.40)
Chloride: 110 mmol/L (ref 98–111)
Creatinine, Ser: 1.2 mg/dL (ref 0.61–1.24)
Glucose, Bld: 96 mg/dL (ref 70–99)
HCT: 40 % (ref 39.0–52.0)
Hemoglobin: 13.6 g/dL (ref 13.0–17.0)
Potassium: 4.2 mmol/L (ref 3.5–5.1)
Sodium: 145 mmol/L (ref 135–145)
TCO2: 27 mmol/L (ref 22–32)

## 2020-01-04 LAB — CBG MONITORING, ED: Glucose-Capillary: 93 mg/dL (ref 70–99)

## 2020-01-04 LAB — TROPONIN I (HIGH SENSITIVITY)
Troponin I (High Sensitivity): 14 ng/L (ref <18)
Troponin I (High Sensitivity): 16 ng/L (ref <18)

## 2020-01-04 LAB — VALPROIC ACID LEVEL: Valproic Acid Lvl: 27 ug/mL — ABNORMAL LOW (ref 50.0–100.0)

## 2020-01-04 NOTE — Discharge Instructions (Signed)
You have been evaluated for your weakness.  Fortunately your labs are reassuring.  Your CT scan of the head is normal.  Your heart is normal.  Your sodium level is also normal as well.  No evidence of infection.  Your Covid test.  Please follow-up with your doctor for further care.  Return if any concern.

## 2020-01-04 NOTE — ED Notes (Signed)
Pt was able to ambulate in the hallway.

## 2020-01-04 NOTE — ED Notes (Signed)
[phone] Wife Glenna Padmore  Would like an update

## 2020-01-04 NOTE — ED Provider Notes (Signed)
[facility]ERGENCY DEPARTMENT Provider Note   CSN: [id number] Arrival date & time: 01/04/20  1152     History Chief Complaint  Patient presents with  . Weakness    [name] is a 79 y.o. male.  The history is provided by the nursing home and medical records. No language interpreter was used.  Weakness    79 year old male significant history of CAD, Alzheimer's, hypertension, brought here via EMS from [facility] per request of his medical power of attorney for evaluations of change in ambulation. Per EMS facility report the patient was walking more slowly and hunched over while walking and since yesterday. No other changes including no change in bowel habits or change in appetite. Level five caveats due to dementia.  Additional history was obtained through patient's spouse over the phone.  Spouse usually visit him once a week.  Last time she saw him he was at his baseline which is approximately 6 days ago.  She was notified by one of the staff that for the past 2 to 3 days patient has been less active and laying back more than usual and sleeping more.  He appears to be his normal self.  She initially was concerned that he may be overmedicated.  Spouse felt that patient has not had any new medication changes recently.  Spouse recall patient has 1 similar episode several months prior and was admitted for abnormal sodium level.  Spouse also reports that patient should be DNR.  He does have a DNR form with him.  Past Medical History:  Diagnosis Date  . Carotid artery disease ([place])    on the right side -60-79%  . Coronary artery disease    prior bypass grafting. Most recent Myoview 2011 demonstrated no ischemia and normal left ventricular function  . Heart murmur   . Hyperlipidemia   . Memory loss   . Sleep apnea    noncompliant with CPAP  . Syncope and collapse    No arrhythmia on loop recorder. Likely orthostatic hypotension    Patient Active  Problem List   Diagnosis Date Noted  . AKI (acute kidney injury) ([place]) 12/14/2018  . Encephalopathy acute 12/14/2018  . Hypernatremia 12/14/2018  . Screening PSA (prostate specific antigen) 05/24/2016  . Benign prostatic hyperplasia 06/04/2015  . Dysthymic disorder 06/04/2015  . High risk medication use 06/04/2015  . Malaise and fatigue 06/04/2015  . Obstructive sleep apnea 06/04/2015  . Paroxysmal supraventricular tachycardia ([place]) 06/04/2015  . Carotid artery plaque 06/04/2015  . Mixed hyperlipidemia 06/04/2015  . Essential hypertension 12/05/2014  . Dementia without behavioral disturbance ([place]) 12/05/2014  . Coronary artery disease involving native coronary artery 12/05/2014  . Alzheimer's disease ([place]) 12/10/2011  . Carotid artery disease ([place])   . Hyperlipidemia   . SYNCOPE 08/08/2008  . CAD, ARTERY BYPASS GRAFT 05/28/2008  . PALPITATIONS 05/28/2008    Past Surgical History:  Procedure Laterality Date  . APPENDECTOMY    . CARDIAC CATHETERIZATION  2010   patent LIMA to LAD, SVG to D1, SVG to OM3, occluded SVG to a small D2. Normal EF  . CORONARY ARTERY BYPASS GRAFT  2006  . HAND SURGERY         Family History  Family history unknown: Yes    Social History   Tobacco Use  . Smoking status: Never Smoker  . Smokeless tobacco: Never Used  Substance Use Topics  . Alcohol use: No  . Drug use: No    Home Medications  Prior to Admission medications   Medication Sig Start Date End Date Taking? Authorizing Provider  ALPRAZolam Duanne Moron) 0.25 MG tablet 1  qam   2  q noon   2  q diner  1  Prn  qday 06/08/18   Plovsky, Berneta Sages, MD  aspirin 325 MG EC tablet Take 325 mg by mouth daily.      [provider]  atorvastatin (LIPITOR) 40 MG tablet TAKE 1 TABLET (40 MG TOTAL) BY MOUTH DAILY 07/09/17   Wellington Hampshire, MD  Cyanocobalamin (B-12 PO) Take 1 tablet by mouth daily.    [provider]  escitalopram (LEXAPRO) 20 MG tablet Take 1 tablet (20 mg total) by  mouth daily. 10/25/12   Tat, Eustace Quail, DO  esomeprazole (NEXIUM) 40 MG capsule Take 1 capsule (40 mg total) by mouth daily before breakfast. 05/16/13   Wellington Hampshire, MD  gabapentin (NEURONTIN) 300 MG capsule 1  qam   2  @ 11:00 am  3  q  4:00 PM Patient not taking: Reported on 01/06/2018 11/04/17   Norma Fredrickson, MD  hydrOXYzine (VISTARIL) 50 MG capsule 1  qday for agitation  PRN Patient not taking: Reported on 01/06/2018 11/04/17   Norma Fredrickson, MD  memantine (NAMENDA) 10 MG tablet TAKE 1 TABLET BY MOUTH 2 TIMES DAILY. 10/06/17   TatEustace Quail, DO  Multiple Vitamin (MULTIVITAMIN) capsule Take 1 capsule by mouth daily.      [provider]  nitroGLYCERIN (NITROSTAT) 0.4 MG SL tablet Place 1 tablet (0.4 mg total) under the tongue every 5 (five) minutes as needed. 05/03/18   Minna Merritts, MD  propranolol (INDERAL) 10 MG tablet TAKE 1 TABLET (10 MG TOTAL) BY MOUTH DAILY AT SUPPER 07/09/17   Wellington Hampshire, MD  Tamsulosin HCl (FLOMAX) 0.4 MG CAPS Take 0.4 mg by mouth daily.      [provider]  traZODone (DESYREL) 50 MG tablet 1  qhs  Prn  May repeat 06/08/18   Norma Fredrickson, MD  tadalafil (CIALIS) 10 MG tablet Take 10 mg by mouth daily as needed.    06/05/11  [provider]    Allergies    Captopril, Methyldopa, and Metoprolol  Review of Systems   Review of Systems  Unable to perform ROS: Dementia  Neurological: Positive for weakness.    Physical Exam Updated Vital Signs BP (!) 153/63 (BP Location: Left Arm)   Pulse 66   Temp 98.2 F (36.8 C) (Oral)   Resp 14   SpO2 99%   Physical Exam Vitals and nursing note reviewed.  Constitutional:      General: He is not in acute distress.    Appearance: He is well-developed.     Comments: Elderly male, lying in bed, sleeping, snoring, arousable and in no acute discomfort  HENT:     Head: Atraumatic.     Mouth/Throat:     Mouth: Mucous membranes are moist.  Eyes:     Conjunctiva/sclera:  Conjunctivae normal.     Pupils: Pupils are equal, round, and reactive to light.  Cardiovascular:     Rate and Rhythm: Normal rate and regular rhythm.     Pulses: Normal pulses.  Pulmonary:     Effort: Pulmonary effort is normal.     Breath sounds: Normal breath sounds. No wheezing, rhonchi or rales.  Abdominal:     Palpations: Abdomen is soft.     Tenderness: There is no abdominal tenderness.  Musculoskeletal:  Cervical back: Neck supple. No rigidity.  Skin:    Findings: No rash.  Neurological:     GCS: GCS eye subscore is 3. GCS verbal subscore is 4. GCS motor subscore is 5.     ED Results / Procedures / Treatments   Labs (all labs ordered are listed, but only abnormal results are displayed) Labs Reviewed  CBC WITH DIFFERENTIAL/PLATELET - Abnormal; Notable for the following components:      Result Value   MCV 101.6 (*)    All other components within normal limits  URINALYSIS, ROUTINE W REFLEX MICROSCOPIC - Abnormal; Notable for the following components:   Ketones, ur 20 (*)    All other components within normal limits  VALPROIC ACID LEVEL - Abnormal; Notable for the following components:   Valproic Acid Lvl 27 (*)    All other components within normal limits  RESPIRATORY PANEL BY RT PCR (FLU A&B, COVID)  I-STAT CHEM 8, ED  CBG MONITORING, ED  TROPONIN I (HIGH SENSITIVITY)  TROPONIN I (HIGH SENSITIVITY)    EKG EKG Interpretation  Date/Time:  Thursday January 04 2020 12:01:21 EDT Ventricular Rate:  66 PR Interval:    QRS Duration: 107 QT Interval:  435 QTC Calculation: 456 R Axis:   -18 Text Interpretation: Sinus rhythm Borderline left axis deviation Repol abnrm suggests ischemia, anterolateral Minimal ST elevation, anterior leads No significant change since last tracing Confirmed by Isla Pence [phone]) on 01/04/2020 12:06:59 PM   Radiology CT Head Wo Contrast  Result Date: 01/04/2020 CLINICAL DATA:  Mental status change.  Weakness. EXAM: CT HEAD  WITHOUT CONTRAST TECHNIQUE: Contiguous axial images were obtained from the base of the skull through the vertex without intravenous contrast. COMPARISON:  09/06/2018 FINDINGS: Brain: No evidence of acute large vascular territory infarction, hemorrhage, hydrocephalus, extra-axial collection or mass lesion/mass effect. Similar patchy white matter hypodense motion, compatible with the sequela of chronic microvascular ischemic disease. Generalized atrophy with ex vacuo ventricular dilation Vascular: Calcific atherosclerosis. Skull: No acute fracture. Sinuses/Orbits: No substantial paranasal sinus disease. Unremarkable orbits. Other: No mastoid effusions. IMPRESSION: 1. No evidence of acute intracranial abnormality. 2. Chronic microvascular ischemic disease and generalized atrophy. Electronically Signed   By: Margaretha Sheffield MD   On: 01/04/2020 14:00    Procedures Procedures (including critical care time)  Medications Ordered in ED Medications - No data to display  ED Course  I have reviewed the triage vital signs and the nursing notes.  Pertinent labs & imaging results that were available during my care of the patient were reviewed by me and considered in my medical decision making (see chart for details).    MDM Rules/Calculators/A&P                          BP (!) 153/63 (BP Location: Left Arm)   Pulse 66   Temp 98.2 F (36.8 C) (Oral)   Resp 14   SpO2 99%   Final Clinical Impression(s) / ED Diagnoses Final diagnoses:  Generalized weakness    Rx / DC Orders ED Discharge Orders    None     12:31 PM Patient here for increased weakness, and altered mental status compared to baseline poor walking and hunching over by staff.  Last known normal 5/was approximately 6 years ago.  Staff noticed a change for the past several days.  He had 1 similar episode several months ago when he was diagnosed with hypernatremia. Patient is stable, vitals are reassuring.  Work-up  initiated.  Care  discussed with Dr. Gilford Raid.   4:43 PM Work-up essentially unremarkable.  Normal troponin, Covid test is negative, electrolyte panels are reassuring.  No leukocytosis, normal CBG, urinalysis unremarkable head CT scan without acute finding. Patient was able to ambulate without assist.  At this time he is stable to return to his facility.  Normal sodium level.  Clerence Gubser Shackleford was evaluated in Emergency Department on 01/04/2020 for the symptoms described in the history of present illness. He was evaluated in the context of the global COVID-19 pandemic, which necessitated consideration that the patient might be at risk for infection with the SARS-CoV-2 virus that causes COVID-19. Institutional protocols and algorithms that pertain to the evaluation of patients at risk for COVID-19 are in a state of rapid change based on information released by regulatory bodies including the CDC and federal and state organizations. These policies and algorithms were followed during the patient's care in the ED.     Domenic Moras, PA-C 01/04/20 [place], Julie, MD 01/07/20 [phone]

## 2020-01-04 NOTE — ED Triage Notes (Addendum)
Patient BIB [facility] from [facility] at request of medical PoA for evaluation of changes in ambulation. Patient has history of dementia, is oriented to self at baseline. Ambulated independently with steady gait at baseline. Per EMS, facility reports patient started walking more slowly and "hunches over while walking" yesterday, reports no changes in urinary or bowel habits, no changes in appetite. Is prescribed trazodone and ativan at facility. No focal deficits, cognition at baseline. 20g saline lock in right AC.

## 2020-01-06 MED FILL — LORazepam 0.5 MG TABS: 0.5 | 60 days supply | Qty: 60 | Fill #1

## 2020-01-18 DIAGNOSIS — J45909 Unspecified asthma, uncomplicated: Secondary | ICD-10-CM | POA: Diagnosis not present

## 2020-01-18 DIAGNOSIS — N400 Enlarged prostate without lower urinary tract symptoms: Secondary | ICD-10-CM | POA: Diagnosis not present

## 2020-01-18 DIAGNOSIS — I739 Peripheral vascular disease, unspecified: Secondary | ICD-10-CM | POA: Diagnosis not present

## 2020-01-18 DIAGNOSIS — R600 Localized edema: Secondary | ICD-10-CM | POA: Diagnosis not present

## 2020-01-19 MED FILL — MEMANTINE HCL 10 MG TABS: 10 | 30 days supply | Qty: 60 | Fill #3

## 2020-01-22 MED FILL — LEVOTHYROXINE 50 MCG TABLET: 50 | 30 days supply | Qty: 30 | Fill #2

## 2020-01-24 DIAGNOSIS — F0390 Unspecified dementia without behavioral disturbance: Secondary | ICD-10-CM | POA: Diagnosis not present

## 2020-01-24 DIAGNOSIS — M7989 Other specified soft tissue disorders: Secondary | ICD-10-CM | POA: Diagnosis not present

## 2020-01-24 DIAGNOSIS — Z743 Need for continuous supervision: Secondary | ICD-10-CM | POA: Diagnosis not present

## 2020-01-24 DIAGNOSIS — M47812 Spondylosis without myelopathy or radiculopathy, cervical region: Secondary | ICD-10-CM | POA: Diagnosis not present

## 2020-01-24 DIAGNOSIS — M4312 Spondylolisthesis, cervical region: Secondary | ICD-10-CM | POA: Diagnosis not present

## 2020-01-24 DIAGNOSIS — W19XXXA Unspecified fall, initial encounter: Secondary | ICD-10-CM | POA: Diagnosis not present

## 2020-01-24 DIAGNOSIS — W1839XA Other fall on same level, initial encounter: Secondary | ICD-10-CM | POA: Diagnosis not present

## 2020-01-24 DIAGNOSIS — R410 Disorientation, unspecified: Secondary | ICD-10-CM | POA: Diagnosis not present

## 2020-01-24 DIAGNOSIS — R279 Unspecified lack of coordination: Secondary | ICD-10-CM | POA: Diagnosis not present

## 2020-01-24 DIAGNOSIS — J3489 Other specified disorders of nose and nasal sinuses: Secondary | ICD-10-CM | POA: Diagnosis not present

## 2020-01-24 DIAGNOSIS — J320 Chronic maxillary sinusitis: Secondary | ICD-10-CM | POA: Diagnosis not present

## 2020-01-24 DIAGNOSIS — G9389 Other specified disorders of brain: Secondary | ICD-10-CM | POA: Diagnosis not present

## 2020-01-24 DIAGNOSIS — R5381 Other malaise: Secondary | ICD-10-CM | POA: Diagnosis not present

## 2020-01-24 DIAGNOSIS — Y999 Unspecified external cause status: Secondary | ICD-10-CM | POA: Diagnosis not present

## 2020-01-24 DIAGNOSIS — R0989 Other specified symptoms and signs involving the circulatory and respiratory systems: Secondary | ICD-10-CM | POA: Diagnosis not present

## 2020-01-24 DIAGNOSIS — G319 Degenerative disease of nervous system, unspecified: Secondary | ICD-10-CM | POA: Diagnosis not present

## 2020-01-24 DIAGNOSIS — R404 Transient alteration of awareness: Secondary | ICD-10-CM | POA: Diagnosis not present

## 2020-01-30 DIAGNOSIS — E119 Type 2 diabetes mellitus without complications: Secondary | ICD-10-CM | POA: Diagnosis not present

## 2020-01-30 DIAGNOSIS — F330 Major depressive disorder, recurrent, mild: Secondary | ICD-10-CM | POA: Diagnosis not present

## 2020-01-30 DIAGNOSIS — Z79899 Other long term (current) drug therapy: Secondary | ICD-10-CM | POA: Diagnosis not present

## 2020-01-30 DIAGNOSIS — E7849 Other hyperlipidemia: Secondary | ICD-10-CM | POA: Diagnosis not present

## 2020-01-30 DIAGNOSIS — D518 Other vitamin B12 deficiency anemias: Secondary | ICD-10-CM | POA: Diagnosis not present

## 2020-01-30 DIAGNOSIS — G301 Alzheimer's disease with late onset: Secondary | ICD-10-CM | POA: Diagnosis not present

## 2020-01-30 DIAGNOSIS — F411 Generalized anxiety disorder: Secondary | ICD-10-CM | POA: Diagnosis not present

## 2020-01-30 DIAGNOSIS — G4721 Circadian rhythm sleep disorder, delayed sleep phase type: Secondary | ICD-10-CM | POA: Diagnosis not present

## 2020-01-30 DIAGNOSIS — F29 Unspecified psychosis not due to a substance or known physiological condition: Secondary | ICD-10-CM | POA: Diagnosis not present

## 2020-02-01 DIAGNOSIS — I2510 Atherosclerotic heart disease of native coronary artery without angina pectoris: Secondary | ICD-10-CM | POA: Diagnosis not present

## 2020-02-01 DIAGNOSIS — F0391 Unspecified dementia with behavioral disturbance: Secondary | ICD-10-CM | POA: Diagnosis not present

## 2020-02-01 DIAGNOSIS — N400 Enlarged prostate without lower urinary tract symptoms: Secondary | ICD-10-CM | POA: Diagnosis not present

## 2020-02-01 MED FILL — ASPIRIN EC 325 MG TABLET: 325 | 90 days supply | Qty: 90 | Fill #1

## 2020-02-01 MED FILL — PROPRANOLOL 10 MG TABLET: 10 | 30 days supply | Qty: 30 | Fill #3

## 2020-02-01 MED FILL — risperiDONE 0.25 MG TABS: 0.25 | 30 days supply | Qty: 60 | Fill #3

## 2020-02-01 MED FILL — FOLIC ACID 1 MG TABS: 1 | 30 days supply | Qty: 30 | Fill #3

## 2020-02-01 MED FILL — TAMSULOSIN HCL 0.4 MG CAP: 0.4 | 30 days supply | Qty: 30 | Fill #3

## 2020-02-01 MED FILL — traZODone HCL 50 MG TABS: 50 | 30 days supply | Qty: 30 | Fill #3

## 2020-02-01 MED FILL — DIVALPROEX SOD ER 500 MG TA: 500 | 30 days supply | Qty: 30 | Fill #3

## 2020-02-01 MED FILL — ESCITALOPRAM 10 MG TABLET: 10 | 30 days supply | Qty: 30 | Fill #3

## 2020-02-07 DIAGNOSIS — U071 COVID-19: Secondary | ICD-10-CM | POA: Diagnosis not present

## 2020-02-07 DIAGNOSIS — Z20828 Contact with and (suspected) exposure to other viral communicable diseases: Secondary | ICD-10-CM | POA: Diagnosis not present

## 2020-02-10 DIAGNOSIS — E870 Hyperosmolality and hypernatremia: Secondary | ICD-10-CM | POA: Diagnosis not present

## 2020-02-10 DIAGNOSIS — F0391 Unspecified dementia with behavioral disturbance: Secondary | ICD-10-CM | POA: Diagnosis not present

## 2020-02-10 DIAGNOSIS — Z888 Allergy status to other drugs, medicaments and biological substances status: Secondary | ICD-10-CM | POA: Diagnosis not present

## 2020-02-10 DIAGNOSIS — U071 COVID-19: Secondary | ICD-10-CM | POA: Diagnosis not present

## 2020-02-10 DIAGNOSIS — R410 Disorientation, unspecified: Secondary | ICD-10-CM | POA: Diagnosis not present

## 2020-02-10 DIAGNOSIS — I10 Essential (primary) hypertension: Secondary | ICD-10-CM | POA: Diagnosis not present

## 2020-02-10 DIAGNOSIS — I959 Hypotension, unspecified: Secondary | ICD-10-CM | POA: Diagnosis not present

## 2020-02-10 DIAGNOSIS — R404 Transient alteration of awareness: Secondary | ICD-10-CM | POA: Diagnosis not present

## 2020-02-10 DIAGNOSIS — G40909 Epilepsy, unspecified, not intractable, without status epilepticus: Secondary | ICD-10-CM | POA: Diagnosis not present

## 2020-02-10 DIAGNOSIS — R0902 Hypoxemia: Secondary | ICD-10-CM | POA: Diagnosis not present

## 2020-02-10 DIAGNOSIS — J189 Pneumonia, unspecified organism: Secondary | ICD-10-CM | POA: Diagnosis not present

## 2020-02-10 DIAGNOSIS — M2550 Pain in unspecified joint: Secondary | ICD-10-CM | POA: Diagnosis not present

## 2020-02-10 DIAGNOSIS — Z7401 Bed confinement status: Secondary | ICD-10-CM | POA: Diagnosis not present

## 2020-02-10 DIAGNOSIS — G928 Other toxic encephalopathy: Secondary | ICD-10-CM | POA: Diagnosis not present

## 2020-02-10 DIAGNOSIS — Z8249 Family history of ischemic heart disease and other diseases of the circulatory system: Secondary | ICD-10-CM | POA: Diagnosis not present

## 2020-02-10 DIAGNOSIS — R4182 Altered mental status, unspecified: Secondary | ICD-10-CM | POA: Diagnosis not present

## 2020-02-10 DIAGNOSIS — J9811 Atelectasis: Secondary | ICD-10-CM | POA: Diagnosis not present

## 2020-02-10 DIAGNOSIS — E860 Dehydration: Secondary | ICD-10-CM | POA: Diagnosis not present

## 2020-02-13 ENCOUNTER — Other Ambulatory Visit: Payer: Self-pay | Admitting: *Deleted

## 2020-02-13 NOTE — Patient Outreach (Signed)
[facility]) Care Management  02/13/2020  [name] 1941/03/11 [id number]  Transition of care telephone call  Referral received: 02/12/20 Initial outreach:02/13/20  Insurance: [facility]   Initial unsuccessful telephone call to patient's preferred number and contact [name] listed as designated party release in order to complete transition of care assessment; no answer, left HIPAA compliant voicemail message requesting return call.   Objective: Per the electronic medical record, Mr. [name]   was hospitalized at [facility] 11/27-11/29/21 for Covid 19 virus infection  . Comorbidities include: Dementia, Seizure disorder, hypertension  He was discharged to [facility] where he is a resident. Plan: This RNCM will route unsuccessful outreach letter with [facility] Management pamphlet and 24 hour Nurse Advice Line Magnet to [facility] Management clinical pool to be mailed to patient's home address. This RNCM will attempt another outreach within 4 business days.  Joylene Draft, RN, BSN  [facility] Management Coordinator  [phone]- Mobile [phone]- Toll Free Main Office

## 2020-02-16 ENCOUNTER — Other Ambulatory Visit: Payer: Self-pay | Admitting: *Deleted

## 2020-02-16 NOTE — Patient Outreach (Signed)
[facility]) Care Management  02/16/2020  [place] 12/16/1940 [id number]   Transition of care call Referral received: 02/12/20 Initial outreach attempt: 02/13/20 Insurance: UMR    2nd unsuccessful telephone call to patient's preferred contact number in order to complete post hospital discharge transition of care assessment , no answer left HIPAA compliant message requesting return call.    Objective: Per the electronic medical record, Mr. [name]   was hospitalized at [facility] 11/27-11/29/21 for Covid 19 virus infection  . Comorbidities include: Dementia, Seizure disorder, hypertension  He was discharged to [facility] where he is a resident    Plan If no return call from patient will attempt 3rd outreach in the next 4 business days.   Joylene Draft, RN, BSN  [facility] Management Coordinator  [phone]- Mobile [phone]- Toll Free Main Office

## 2020-02-18 MED FILL — MEMANTINE HCL 10 MG TABS: 10 | 30 days supply | Qty: 60 | Fill #4

## 2020-02-20 ENCOUNTER — Encounter: Payer: Self-pay | Admitting: *Deleted

## 2020-02-20 ENCOUNTER — Other Ambulatory Visit: Payer: Self-pay | Admitting: *Deleted

## 2020-02-20 NOTE — Patient Outreach (Addendum)
[facility]) Care Management  02/20/2020  [place] Oct 05, 1940 [id number]   Transition of care call/case closure   Referral received:02/12/20 Initial outreach:02/13/20 Insurance: UMR    Subjective: Initial successful telephone call to patient's preferred number in order to complete transition of care assessment; 2 HIPAA identifiers verified. Spoke with wife Hao Dion ,designated party release, Explained purpose of call and completed transition of care assessment.  [name] states Dreshon resides in [facility] assisted living prior to admission and returned there after discharge. She discussed patient history of Dementia and care at home became overwhelming and unsafe to manage at home, he has been at facility about 1year and half.  She states since discharge he is weak but seems to be  getting back some strength.   Discussed [place] benefit of employee assistance counseling , she voiced being familiar with resources and has contact number.  Objective:  Mr. Brook Chriscoewas hospitalized at [facility] 11/27-11/29/21 for Covid 19 virus infection . Comorbidities include: Dementia, Seizure disorder, hypertensionHe was discharged to [facility] where he is a resident  Assessment:  Patient wife  voices good understanding of all discharge instructions/plan  See transition of care flowsheet for assessment details.   Plan:  Reviewed hospital discharge diagnosis of Covid 19   and discharge treatment plan using hospital discharge instructions,   Reviewed [facility] healthy lifestyle program information to receive discounted premium for  2022   Step 1: Get  your annual physical  Step 2: Complete your health assessment  Step 3:Identify your current health status and complete the corresponding action step between January 1, and November 15, 2019.        No ongoing care management needs identified so will  close case to [facility] Management services. An unsuccessful outreach letter was sent to patient/wife home at initial outreach on 02/13/20  Thanked Mrs. Bernick  for their services to [facility].  Joylene Draft, RN, BSN  [facility] Management Coordinator  [phone]- Mobile [phone]- Toll Free Main Office

## 2020-02-21 MED FILL — LEVOTHYROXINE 50 MCG TABLET: 50 | 30 days supply | Qty: 30 | Fill #3

## 2020-03-02 MED FILL — DIVALPROEX SOD ER 500 MG TA: 500 | 30 days supply | Qty: 30 | Fill #4

## 2020-03-02 MED FILL — PROPRANOLOL 10 MG TABLET: 10 | 30 days supply | Qty: 30 | Fill #4

## 2020-03-02 MED FILL — FOLIC ACID 1 MG TABS: 1 | 30 days supply | Qty: 30 | Fill #4

## 2020-03-02 MED FILL — traZODone HCL 50 MG TABS: 50 | 30 days supply | Qty: 30 | Fill #4

## 2020-03-02 MED FILL — ESCITALOPRAM 10 MG TABLET: 10 | 30 days supply | Qty: 30 | Fill #4

## 2020-03-02 MED FILL — risperiDONE 0.25 MG TABS: 0.25 | 30 days supply | Qty: 60 | Fill #4

## 2020-03-02 MED FILL — TAMSULOSIN HCL 0.4 MG CAP: 0.4 | 30 days supply | Qty: 30 | Fill #4

## 2020-03-06 MED FILL — LORazepam 0.5 MG TABS: 0.5 | 60 days supply | Qty: 60 | Fill #2

## 2020-03-07 DIAGNOSIS — R0789 Other chest pain: Secondary | ICD-10-CM | POA: Diagnosis not present

## 2020-03-07 DIAGNOSIS — J90 Pleural effusion, not elsewhere classified: Secondary | ICD-10-CM | POA: Diagnosis not present

## 2020-03-07 DIAGNOSIS — R079 Chest pain, unspecified: Secondary | ICD-10-CM | POA: Diagnosis not present

## 2020-03-07 DIAGNOSIS — R404 Transient alteration of awareness: Secondary | ICD-10-CM | POA: Diagnosis not present

## 2020-03-07 DIAGNOSIS — J9811 Atelectasis: Secondary | ICD-10-CM | POA: Diagnosis not present

## 2020-03-07 DIAGNOSIS — F0390 Unspecified dementia without behavioral disturbance: Secondary | ICD-10-CM | POA: Diagnosis not present

## 2020-03-08 DIAGNOSIS — M2550 Pain in unspecified joint: Secondary | ICD-10-CM | POA: Diagnosis not present

## 2020-03-08 DIAGNOSIS — R404 Transient alteration of awareness: Secondary | ICD-10-CM | POA: Diagnosis not present

## 2020-03-08 DIAGNOSIS — Z7401 Bed confinement status: Secondary | ICD-10-CM | POA: Diagnosis not present

## 2020-03-08 DIAGNOSIS — I959 Hypotension, unspecified: Secondary | ICD-10-CM | POA: Diagnosis not present

## 2020-03-10 DIAGNOSIS — I491 Atrial premature depolarization: Secondary | ICD-10-CM | POA: Diagnosis not present

## 2020-03-12 DIAGNOSIS — F0391 Unspecified dementia with behavioral disturbance: Secondary | ICD-10-CM | POA: Diagnosis not present

## 2020-03-12 DIAGNOSIS — I2510 Atherosclerotic heart disease of native coronary artery without angina pectoris: Secondary | ICD-10-CM | POA: Diagnosis not present

## 2020-03-12 DIAGNOSIS — I10 Essential (primary) hypertension: Secondary | ICD-10-CM | POA: Diagnosis not present

## 2020-03-12 DIAGNOSIS — F0390 Unspecified dementia without behavioral disturbance: Secondary | ICD-10-CM | POA: Diagnosis not present

## 2020-03-18 DIAGNOSIS — F330 Major depressive disorder, recurrent, mild: Secondary | ICD-10-CM | POA: Diagnosis not present

## 2020-03-18 DIAGNOSIS — G4721 Circadian rhythm sleep disorder, delayed sleep phase type: Secondary | ICD-10-CM | POA: Diagnosis not present

## 2020-03-18 DIAGNOSIS — F29 Unspecified psychosis not due to a substance or known physiological condition: Secondary | ICD-10-CM | POA: Diagnosis not present

## 2020-03-18 DIAGNOSIS — G301 Alzheimer's disease with late onset: Secondary | ICD-10-CM | POA: Diagnosis not present

## 2020-03-18 DIAGNOSIS — F411 Generalized anxiety disorder: Secondary | ICD-10-CM | POA: Diagnosis not present

## 2020-03-20 MED FILL — MEMANTINE HCL 10 MG TABS: 10 | 30 days supply | Qty: 60 | Fill #5

## 2020-03-22 MED FILL — LEVOTHYROXINE 50 MCG TABLET: 50 | 30 days supply | Qty: 30 | Fill #4

## 2020-03-26 DIAGNOSIS — Z20828 Contact with and (suspected) exposure to other viral communicable diseases: Secondary | ICD-10-CM | POA: Diagnosis not present

## 2020-03-26 DIAGNOSIS — U071 COVID-19: Secondary | ICD-10-CM | POA: Diagnosis not present

## 2020-04-01 MED FILL — PROPRANOLOL 10 MG TABLET: 10 | 30 days supply | Qty: 30 | Fill #5

## 2020-04-01 MED FILL — risperiDONE 0.25 MG TABS: 0.25 | 30 days supply | Qty: 60 | Fill #5

## 2020-04-01 MED FILL — DIVALPROEX SOD ER 500 MG TA: 500 | 30 days supply | Qty: 30 | Fill #5

## 2020-04-01 MED FILL — TAMSULOSIN HCL 0.4 MG CAP: 0.4 | 30 days supply | Qty: 30 | Fill #5

## 2020-04-01 MED FILL — FOLIC ACID 1 MG TABS: 1 | 30 days supply | Qty: 30 | Fill #5

## 2020-04-01 MED FILL — traZODone HCL 50 MG TABS: 50 | 30 days supply | Qty: 30 | Fill #5

## 2020-04-01 MED FILL — ESCITALOPRAM 10 MG TABLET: 10 | 30 days supply | Qty: 30 | Fill #5

## 2020-04-02 DIAGNOSIS — U071 COVID-19: Secondary | ICD-10-CM | POA: Diagnosis not present

## 2020-04-02 DIAGNOSIS — Z20828 Contact with and (suspected) exposure to other viral communicable diseases: Secondary | ICD-10-CM | POA: Diagnosis not present

## 2020-04-08 DIAGNOSIS — U071 COVID-19: Secondary | ICD-10-CM | POA: Diagnosis not present

## 2020-04-08 DIAGNOSIS — Z20828 Contact with and (suspected) exposure to other viral communicable diseases: Secondary | ICD-10-CM | POA: Diagnosis not present

## 2020-04-09 MED FILL — risperiDONE 0.5 MG TABS: 0.5 | 90 days supply | Qty: 180 | Fill #0

## 2020-04-09 MED FILL — CERTAVITE/ANTIOXIDANTS TABS: 130 days supply | Qty: 130 | Fill #0

## 2020-04-09 MED FILL — DIVALPROEX SODIUM 125 MG CA: 125 | 90 days supply | Qty: 360 | Fill #0

## 2020-04-09 MED FILL — DOXAZOSIN MESYLATE TAB 1 MG: 1 | 90 days supply | Qty: 90 | Fill #0

## 2020-04-09 MED FILL — VITAMIN D3 25 MCG (1000 UT): 25 MCG | 100 days supply | Qty: 100 | Fill #0

## 2020-04-11 DIAGNOSIS — F0391 Unspecified dementia with behavioral disturbance: Secondary | ICD-10-CM | POA: Diagnosis not present

## 2020-04-11 DIAGNOSIS — I10 Essential (primary) hypertension: Secondary | ICD-10-CM | POA: Diagnosis not present

## 2020-04-11 DIAGNOSIS — F0390 Unspecified dementia without behavioral disturbance: Secondary | ICD-10-CM | POA: Diagnosis not present

## 2020-04-11 DIAGNOSIS — I2510 Atherosclerotic heart disease of native coronary artery without angina pectoris: Secondary | ICD-10-CM | POA: Diagnosis not present

## 2020-04-15 DIAGNOSIS — Z20828 Contact with and (suspected) exposure to other viral communicable diseases: Secondary | ICD-10-CM | POA: Diagnosis not present

## 2020-04-15 DIAGNOSIS — U071 COVID-19: Secondary | ICD-10-CM | POA: Diagnosis not present

## 2020-04-16 DIAGNOSIS — A419 Sepsis, unspecified organism: Secondary | ICD-10-CM | POA: Diagnosis not present

## 2020-04-16 DIAGNOSIS — Z66 Do not resuscitate: Secondary | ICD-10-CM | POA: Diagnosis not present

## 2020-04-16 DIAGNOSIS — E860 Dehydration: Secondary | ICD-10-CM | POA: Diagnosis not present

## 2020-04-16 DIAGNOSIS — Y998 Other external cause status: Secondary | ICD-10-CM | POA: Diagnosis not present

## 2020-04-16 DIAGNOSIS — U071 COVID-19: Secondary | ICD-10-CM | POA: Diagnosis not present

## 2020-04-16 DIAGNOSIS — K5939 Other megacolon: Secondary | ICD-10-CM | POA: Diagnosis not present

## 2020-04-16 DIAGNOSIS — G309 Alzheimer's disease, unspecified: Secondary | ICD-10-CM | POA: Diagnosis not present

## 2020-04-16 DIAGNOSIS — Z452 Encounter for adjustment and management of vascular access device: Secondary | ICD-10-CM | POA: Diagnosis not present

## 2020-04-16 DIAGNOSIS — K6389 Other specified diseases of intestine: Secondary | ICD-10-CM | POA: Diagnosis not present

## 2020-04-16 DIAGNOSIS — Z515 Encounter for palliative care: Secondary | ICD-10-CM | POA: Diagnosis not present

## 2020-04-16 DIAGNOSIS — I10 Essential (primary) hypertension: Secondary | ICD-10-CM | POA: Diagnosis not present

## 2020-04-16 DIAGNOSIS — Z781 Physical restraint status: Secondary | ICD-10-CM | POA: Diagnosis not present

## 2020-04-16 DIAGNOSIS — I2510 Atherosclerotic heart disease of native coronary artery without angina pectoris: Secondary | ICD-10-CM | POA: Diagnosis not present

## 2020-04-16 DIAGNOSIS — G9349 Other encephalopathy: Secondary | ICD-10-CM | POA: Diagnosis not present

## 2020-04-16 DIAGNOSIS — G9341 Metabolic encephalopathy: Secondary | ICD-10-CM | POA: Diagnosis not present

## 2020-04-16 DIAGNOSIS — R001 Bradycardia, unspecified: Secondary | ICD-10-CM | POA: Diagnosis not present

## 2020-04-16 DIAGNOSIS — F0390 Unspecified dementia without behavioral disturbance: Secondary | ICD-10-CM | POA: Diagnosis not present

## 2020-04-16 DIAGNOSIS — R4020 Unspecified coma: Secondary | ICD-10-CM | POA: Diagnosis not present

## 2020-04-16 DIAGNOSIS — N178 Other acute kidney failure: Secondary | ICD-10-CM | POA: Diagnosis not present

## 2020-04-16 DIAGNOSIS — G301 Alzheimer's disease with late onset: Secondary | ICD-10-CM | POA: Diagnosis not present

## 2020-04-16 DIAGNOSIS — I499 Cardiac arrhythmia, unspecified: Secondary | ICD-10-CM | POA: Diagnosis not present

## 2020-04-16 DIAGNOSIS — J9601 Acute respiratory failure with hypoxia: Secondary | ICD-10-CM | POA: Diagnosis not present

## 2020-04-16 DIAGNOSIS — D72829 Elevated white blood cell count, unspecified: Secondary | ICD-10-CM | POA: Diagnosis not present

## 2020-04-16 DIAGNOSIS — Z7401 Bed confinement status: Secondary | ICD-10-CM | POA: Diagnosis not present

## 2020-04-16 DIAGNOSIS — I959 Hypotension, unspecified: Secondary | ICD-10-CM | POA: Diagnosis not present

## 2020-04-16 DIAGNOSIS — J9621 Acute and chronic respiratory failure with hypoxia: Secondary | ICD-10-CM | POA: Diagnosis not present

## 2020-04-16 DIAGNOSIS — R4182 Altered mental status, unspecified: Secondary | ICD-10-CM | POA: Diagnosis not present

## 2020-04-16 DIAGNOSIS — X58XXXA Exposure to other specified factors, initial encounter: Secondary | ICD-10-CM | POA: Diagnosis not present

## 2020-04-16 DIAGNOSIS — N179 Acute kidney failure, unspecified: Secondary | ICD-10-CM | POA: Diagnosis not present

## 2020-04-16 DIAGNOSIS — R404 Transient alteration of awareness: Secondary | ICD-10-CM | POA: Diagnosis not present

## 2020-04-16 DIAGNOSIS — G40909 Epilepsy, unspecified, not intractable, without status epilepticus: Secondary | ICD-10-CM | POA: Diagnosis not present

## 2020-04-16 DIAGNOSIS — Z743 Need for continuous supervision: Secondary | ICD-10-CM | POA: Diagnosis not present

## 2020-04-16 DIAGNOSIS — M6282 Rhabdomyolysis: Secondary | ICD-10-CM | POA: Diagnosis not present

## 2020-04-16 DIAGNOSIS — N400 Enlarged prostate without lower urinary tract symptoms: Secondary | ICD-10-CM | POA: Diagnosis not present

## 2020-04-16 DIAGNOSIS — R6521 Severe sepsis with septic shock: Secondary | ICD-10-CM | POA: Diagnosis not present

## 2020-04-16 DIAGNOSIS — E785 Hyperlipidemia, unspecified: Secondary | ICD-10-CM | POA: Diagnosis not present

## 2020-04-16 DIAGNOSIS — F0280 Dementia in other diseases classified elsewhere without behavioral disturbance: Secondary | ICD-10-CM | POA: Diagnosis not present

## 2020-04-16 DIAGNOSIS — R1310 Dysphagia, unspecified: Secondary | ICD-10-CM | POA: Diagnosis not present

## 2020-04-16 DIAGNOSIS — T796XXA Traumatic ischemia of muscle, initial encounter: Secondary | ICD-10-CM | POA: Diagnosis not present

## 2020-04-16 DIAGNOSIS — Z4682 Encounter for fitting and adjustment of non-vascular catheter: Secondary | ICD-10-CM | POA: Diagnosis not present

## 2020-04-16 DIAGNOSIS — E872 Acidosis: Secondary | ICD-10-CM | POA: Diagnosis not present

## 2020-04-16 DIAGNOSIS — M2550 Pain in unspecified joint: Secondary | ICD-10-CM | POA: Diagnosis not present

## 2020-04-18 MED FILL — MEMANTINE HCL 10 MG TABS: 10 | 90 days supply | Qty: 180 | Fill #0

## 2020-04-21 MED FILL — LEVOTHYROXINE 50 MCG TABLET: 50 | 30 days supply | Qty: 30 | Fill #5

## 2020-04-22 ENCOUNTER — Other Ambulatory Visit: Payer: Self-pay | Admitting: *Deleted

## 2020-04-22 NOTE — Patient Outreach (Signed)
[facility]) Care Management  04/22/2020  [place] 1941-01-17 [id number]   Transition of care /Case Closure  Referral received:04/18/20 Initial outreach:04/22/20 Insurance: UMR, Medicare A&B   Subjective:  Received referral for transition of care, noted patient with inpatient admission a [facility], 2/1-2/4 Dx: Acute Encephalopathy Rhabdomyolysis. PMHX: Dementia, seizure disorder, CAD.   Patient was discharged on 2/4 with [facility], Hospice home at  [place] call to [place] to confirm patient enrollment, spoke with Ebony Hail she reports patient was admitted to [facility] on 2/4 and passed on that same day.   Plan Case closed to [place] care management services, patient deceased.    Joylene Draft, RN, BSN  [facility] Management Coordinator  [phone]- Mobile [phone]- Toll Free Main Office .

## 2020-05-14 DEATH — deceased

## 2020-06-04 ENCOUNTER — Other Ambulatory Visit (HOSPITAL_BASED_OUTPATIENT_CLINIC_OR_DEPARTMENT_OTHER): Payer: Self-pay

## 2021-01-06 IMAGING — CT CT HEAD WITHOUT CONTRAST
4 series · 16 of 47 positions shown, 18 images · non-contrast
Comparison: 05/27/2011

CLINICAL DATA: Fall, trauma

EXAM:
CT HEAD WITHOUT CONTRAST
CT CERVICAL SPINE WITHOUT CONTRAST
TECHNIQUE: Multidetector CT imaging of the head and cervical spine was
performed following the standard protocol without intravenous
contrast. Multiplanar CT image reconstructions of the cervical spine
were also generated.

[Series 3: head without · axial · non-contrast · 0.44mm/px · z∈[-610,-490]mm · 6 of 34 slices shown, 8 images]
[im 5/34  brain]
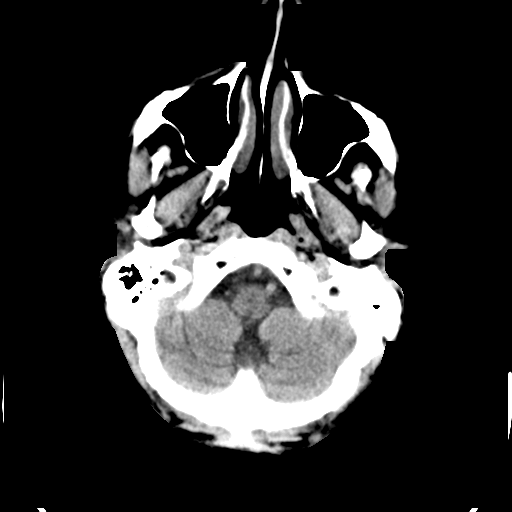
[im 5/34  bone]
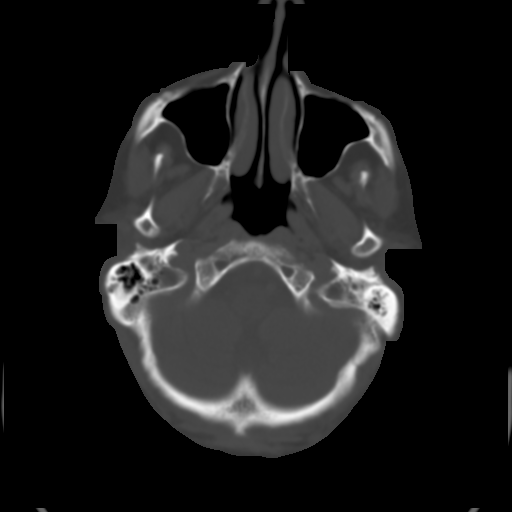
[im 10/34  brain]
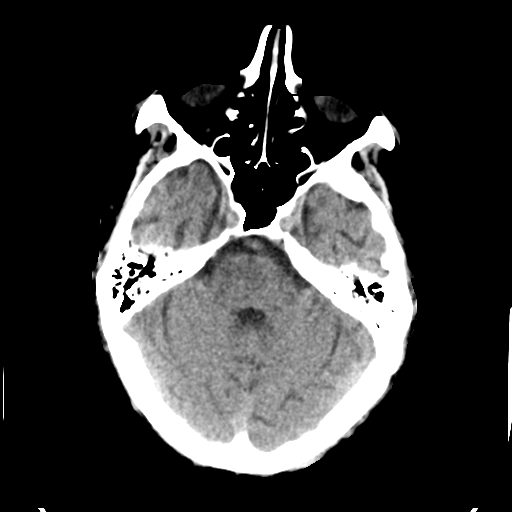
[im 15/34  brain]
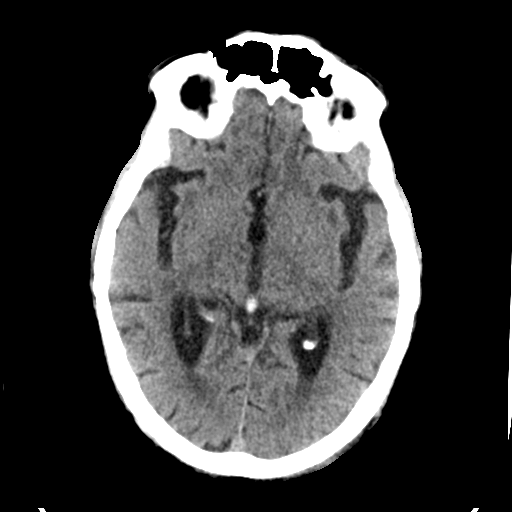
[im 19/34  brain]
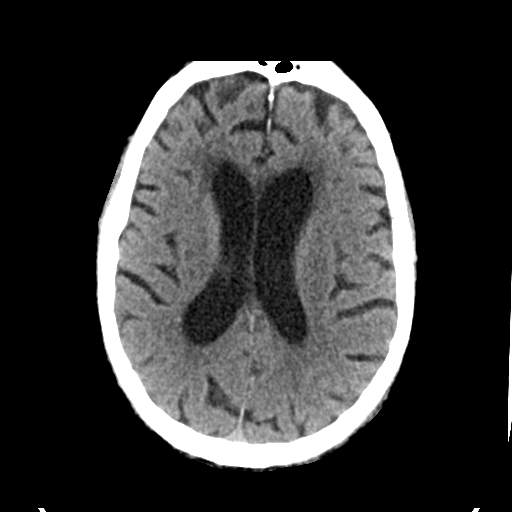
[im 24/34  brain]
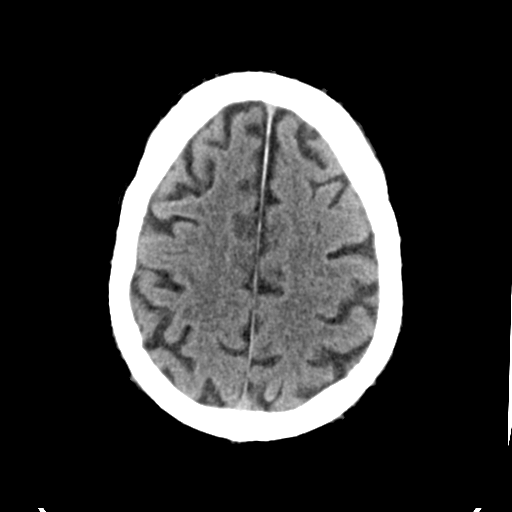
[im 24/34  bone]
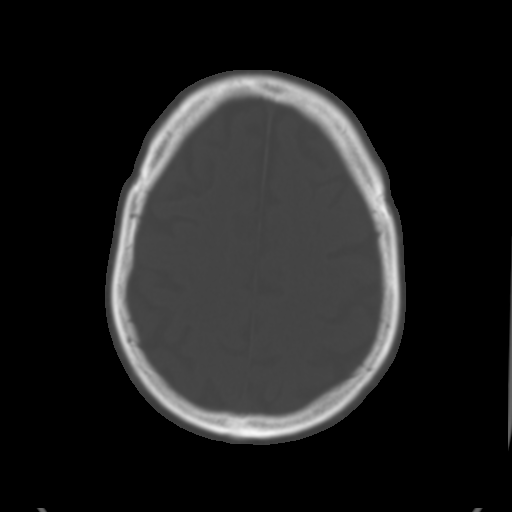
[im 29/34  brain]
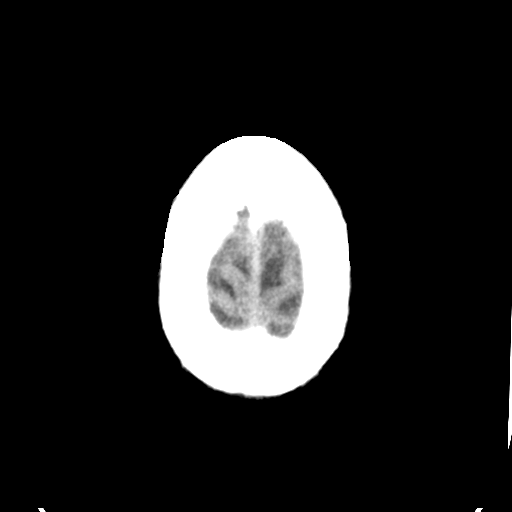

[Series 4: head bone · axial · 0.44mm/px · z∈[-614,-558]mm · 4 of 86 slices shown]
[im 9/86  bone]
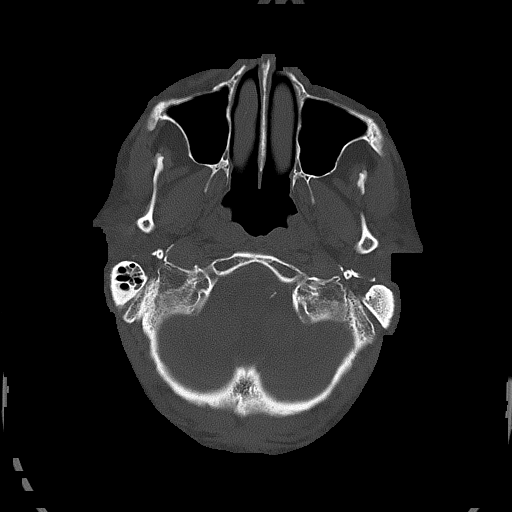
[im 17/86  bone]
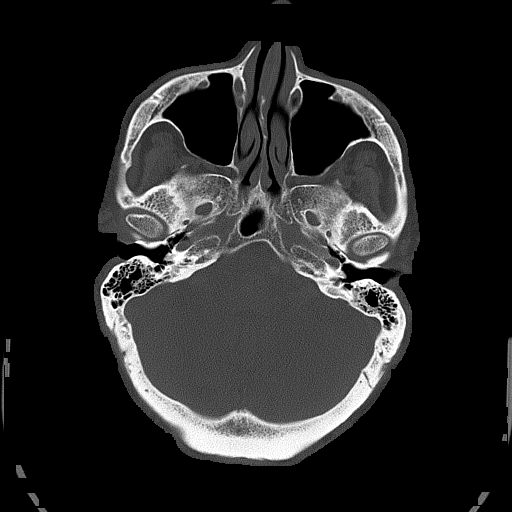
[im 29/86  bone]
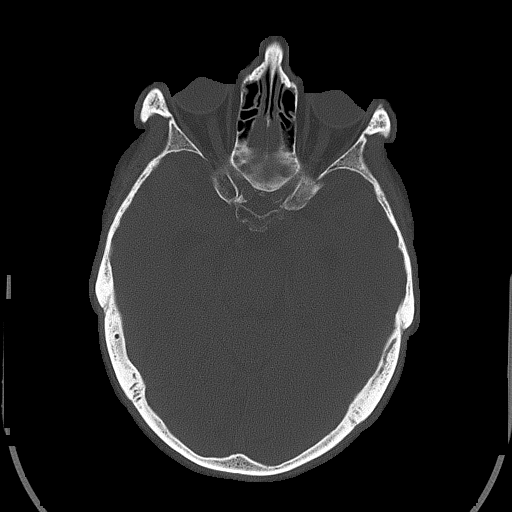
[im 37/86  bone]
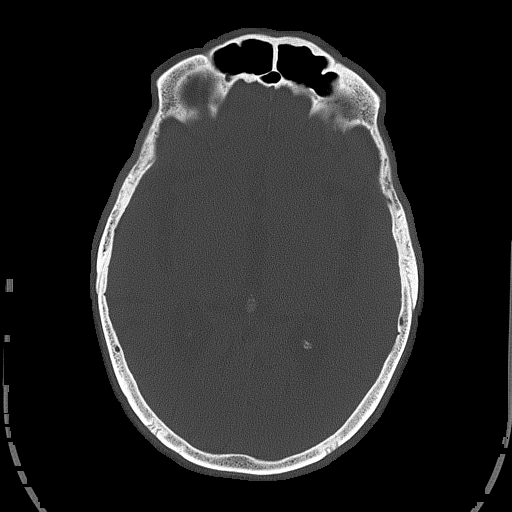

[Series 5: head without cor · coronal · non-contrast · 0.33mm/px · 3 of 73 slices shown]
[im 25/73  brain]
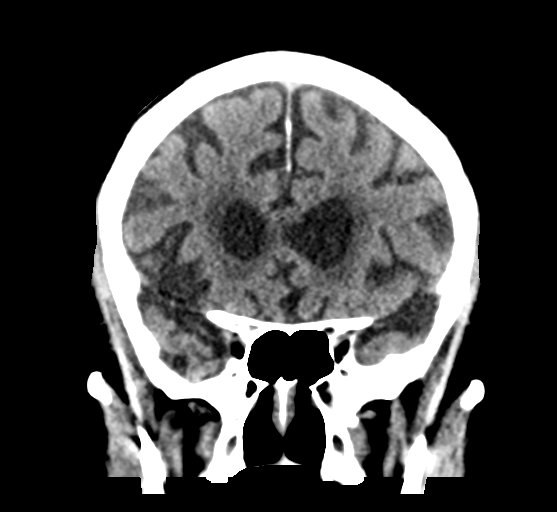
[im 33/73  brain]
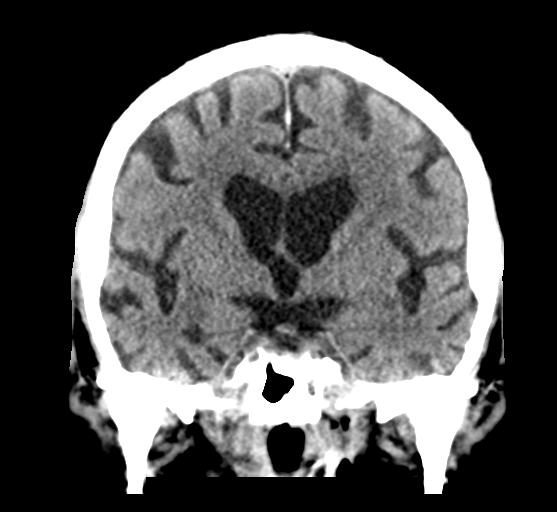
[im 41/73  brain]
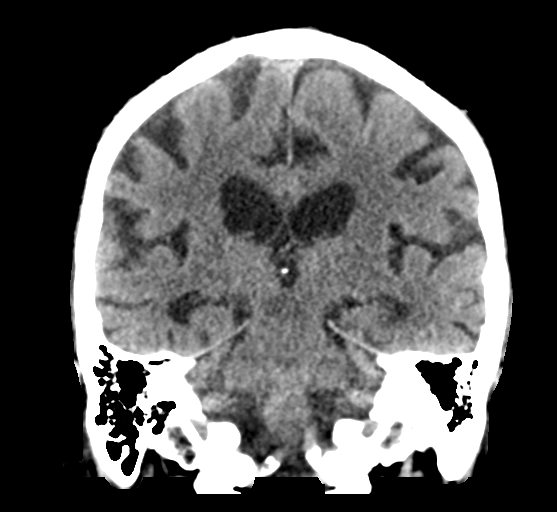

[Series 6: head without sag · sagittal · non-contrast · 0.33mm/px · 3 of 62 slices shown]
[im 21/62  brain]
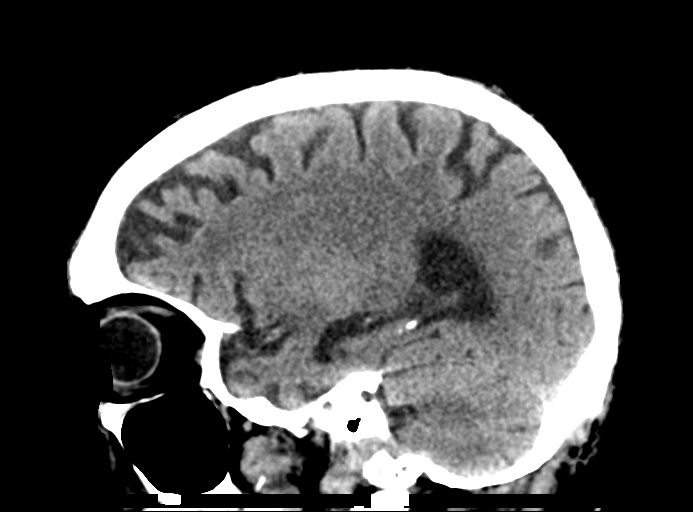
[im 31/62  brain]
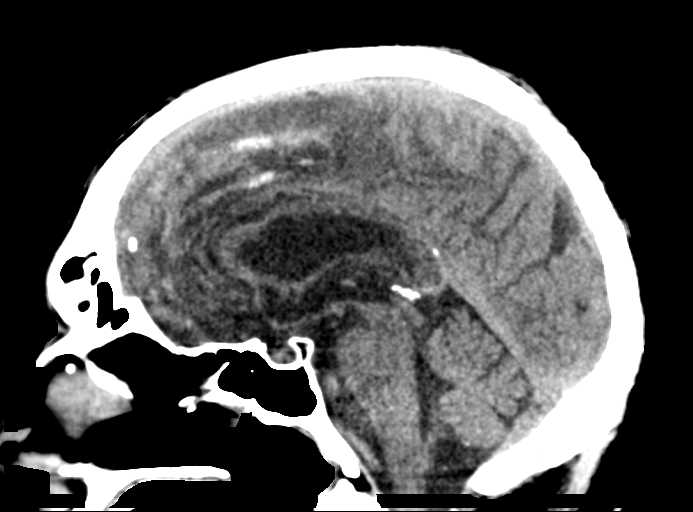
[im 41/62  brain]
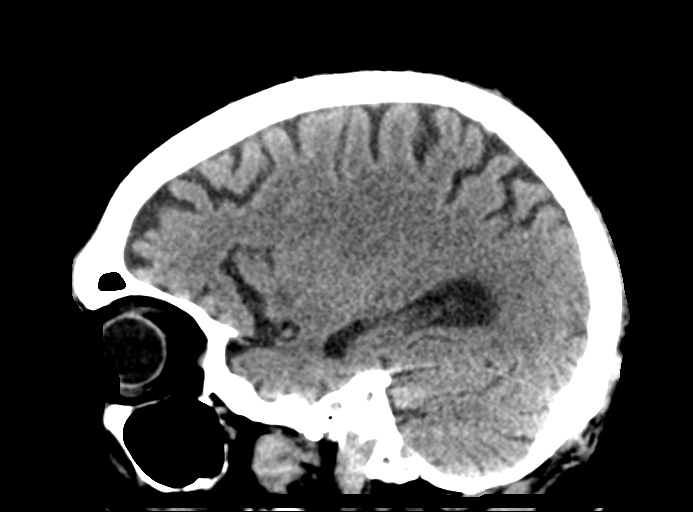

[16 of 47 positions shown; findings below may reference images not displayed]

FINDINGS: CT HEAD FINDINGS

Brain: No evidence of acute infarction, hemorrhage, hydrocephalus,
extra-axial collection or mass lesion/mass effect. Mild
periventricular white matter hypodensity and global volume loss.

Vascular: No hyperdense vessel or unexpected calcification.

Skull: Normal. Negative for fracture or focal lesion.

Sinuses/Orbits: No acute finding.

Other: None.

CT CERVICAL SPINE FINDINGS

Alignment: Normal.

Skull base and vertebrae: Osteopenia. There is a subtle superior
endplate deformity of the C7 vertebral body (series 6, image 29). No
primary bone lesion or focal pathologic process.

Soft tissues and spinal canal: No prevertebral fluid or swelling. No
visible canal hematoma.

Disc levels: Multilevel disc degenerative disease, moderate at C4
through C6.

Upper chest: Negative.

Other: None.
IMPRESSION: 1. No acute intracranial pathology. Mild small-vessel white matter
disease.

2. Osteopenia. There is a subtle superior endplate deformity of the
C7 vertebral body (series 6, image 29). Correlate for acute pain and
point tenderness. MRI may be used to evaluate for marrow edema and
fracture acuity if desired.

3.  No other fracture or static subluxation of the cervical spine.
